# Patient Record
Sex: Male | Born: 1970 | Race: White | Hispanic: No | Marital: Married | Smoking: Former smoker
Health system: Southern US, Community
[De-identification: ages and names within clinical notes are randomized; demographics above are authoritative.]

## PROBLEM LIST (undated history)

## (undated) DIAGNOSIS — F909 Attention-deficit hyperactivity disorder, unspecified type: Secondary | ICD-10-CM

## (undated) HISTORY — PX: NO PAST SURGERIES: SHX2092

---

## 2017-07-18 ENCOUNTER — Ambulatory Visit
Admission: EM | Admit: 2017-07-18 | Discharge: 2017-07-18 | Disposition: A | Discharge status: Home / Self Care | Payer: Managed Care, Other (non HMO) | Attending: Family Medicine | Admitting: Family Medicine

## 2017-07-18 ENCOUNTER — Encounter: Payer: Self-pay | Admitting: Emergency Medicine

## 2017-07-18 ENCOUNTER — Ambulatory Visit (INDEPENDENT_AMBULATORY_CARE_PROVIDER_SITE_OTHER): Payer: Managed Care, Other (non HMO)

## 2017-07-18 DIAGNOSIS — M25511 Pain in right shoulder: Secondary | ICD-10-CM

## 2017-07-18 DIAGNOSIS — S46911A Strain of unspecified muscle, fascia and tendon at shoulder and upper arm level, right arm, initial encounter: Secondary | ICD-10-CM

## 2017-07-18 MED ORDER — MELOXICAM 15 MG PO TABS: 15.0000 mg | ORAL_TABLET | Freq: Every day | ORAL | 0 refills | Status: DC

## 2017-07-18 NOTE — ED Triage Notes (Signed)
Patient states that he fell about 2 weeks ago.  Patient reports ongoing pain in his right shoulder and upper arm.

## 2017-07-18 NOTE — ED Provider Notes (Signed)
[facility]    CSN: [id number] Arrival date & time: 07/18/17  0941     History   Chief Complaint Chief Complaint  Patient presents with  . Shoulder Pain    HPI Piercen Covino is a 46 y.o. male.   Patient is a 46 year old white male who states that in the first week of June  he fell against a wall and developed pain in his right shoulder. Unfortunately he did admit to having some alcohol in his system at the time and he cannot equivocally state how he fell on his right shoulder or what was the mechanism of injury. Since then he's had difficulty using his right shoulder and pain he finally came in to be seen and evaluated the right shoulder. Past medical history none seen pertinent surgical history no pertinent medical problems and no chronic or pertinent family medical history reviewed. He is a former smoker. No known drug allergies. He denies any loss of consciousness when he did fall and injured his right shoulder   The history is provided by the patient. No language interpreter was used.  Shoulder Pain  Location:  Shoulder Shoulder location:  R shoulder Injury: yes   Time since incident:  2 months Mechanism of injury: fall   Fall:    Fall occurred:  Walking and tripped   Impact surface:  [employer] of impact:  Right shoulder.   Entrapped after fall: no   Pain details:    Quality:  Aching, sharp and throbbing   Radiates to:  Does not radiate   Severity:  Moderate   Onset quality:  Sudden   Duration:  2 months   Timing:  Constant   Progression:  Worsening Handedness:  Right-handed Dislocation: no   Foreign body present:  No foreign bodies Prior injury to area:  No Relieved by:  Nothing   History reviewed. No pertinent past medical history.  There are no active problems to display for this patient.   History reviewed. No pertinent surgical history.     Home Medications    Prior to Admission medications   Not on File    Family History History  reviewed. No pertinent family history.  Social History Social History  Substance Use Topics  . Smoking status: Former Games developer  . Smokeless tobacco: Never Used  . Alcohol use Yes     Allergies   Patient has no known allergies.   Review of Systems Review of Systems  Musculoskeletal: Positive for arthralgias, joint swelling and myalgias.  All other systems reviewed and are negative.    Physical Exam Triage Vital Signs ED Triage Vitals  Enc Vitals Group     BP 07/18/17 1000 119/87     Pulse Rate 07/18/17 1000 71     Resp 07/18/17 1000 16     Temp 07/18/17 1000 97.9 F (36.6 C)     Temp Source 07/18/17 1000 Oral     SpO2 07/18/17 1000 96 %     Weight 07/18/17 0957 220 lb (99.8 kg)     Height 07/18/17 0957 5\' 11"  (1.803 m)     Head Circumference --      Peak Flow --      Pain Score 07/18/17 0957 7     Pain Loc --      Pain Edu? --      Excl. in [place]? --    No data found.   Updated Vital Signs BP 119/87 (BP Location: Left Arm)  Pulse 71   Temp 97.9 F (36.6 C) (Oral)   Resp 16   Ht 5\' 11"  (1.803 m)   Wt 220 lb (99.8 kg)   SpO2 96%   BMI 30.68 kg/m   Visual Acuity Right Eye Distance:   Left Eye Distance:   Bilateral Distance:    Right Eye Near:   Left Eye Near:    Bilateral Near:     Physical Exam  Constitutional: He is oriented to person, place, and time. He appears well-developed and well-nourished.  HENT:  Head: Normocephalic and atraumatic.  Right Ear: External ear normal.  Left Ear: External ear normal.  Mouth/Throat: Oropharynx is clear and moist.  Eyes: Pupils are equal, round, and reactive to light.  Neck: Normal range of motion. Neck supple.  Pulmonary/Chest: Effort normal.  Musculoskeletal: He exhibits tenderness.       Right shoulder: He exhibits tenderness, bony tenderness, spasm and decreased strength.  Patient right shoulder looks as if this atrophy compared to the left shoulder he has difficulty raising the right arm above his head  also unable to resist me with the raised arm and empty beer can maneuver. Is also unable to bring his right arm all the way to the back like his left arm indicating some freezing of that right shoulders well.  Neurological: He is alert and oriented to person, place, and time.  Skin: Skin is warm.  Psychiatric: He has a normal mood and affect.  Vitals reviewed.    UC Treatments / Results  Labs (all labs ordered are listed, but only abnormal results are displayed) Labs Reviewed - No data to display  EKG  EKG Interpretation None       Radiology No results found.  Procedures Procedures (including critical care time)  Medications Ordered in UC Medications - No data to display   Initial Impression / Assessment and Plan / UC Course  I have reviewed the triage vital signs and the nursing notes.  Pertinent labs & imaging results that were available during my care of the patient were reviewed by me and considered in my medical decision making (see chart for details).     We'll x-ray the right shoulder are recommend right sling but concern over possible rotator cuff injury referring to orthopedic for further evaluation and possible MRI of the shoulder   We'll place the sling place a Mobic 15 mg daily can see the orthopedic  Final Clinical Impressions(s) / UC Diagnoses   Final diagnoses:  None    New Prescriptions New Prescriptions   No medications on file    Note: This dictation was prepared with Dragon dictation along with smaller phrase technology. Any transcriptional errors that result from this process are unintentional. Controlled Substance Prescriptions [place] Controlled Substance Registry consulted? Not Applicable   Hassan RowanWade, Radley Teston, MD 07/18/17 2206

## 2020-05-19 ENCOUNTER — Other Ambulatory Visit: Payer: Self-pay

## 2020-05-19 ENCOUNTER — Encounter: Payer: Self-pay | Admitting: Emergency Medicine

## 2020-05-19 ENCOUNTER — Ambulatory Visit
Admission: EM | Admit: 2020-05-19 | Discharge: 2020-05-19 | Disposition: A | Discharge status: Home / Self Care | Payer: 59 | Attending: Family Medicine | Admitting: Family Medicine

## 2020-05-19 ENCOUNTER — Ambulatory Visit (INDEPENDENT_AMBULATORY_CARE_PROVIDER_SITE_OTHER): Payer: 59

## 2020-05-19 DIAGNOSIS — S93602A Unspecified sprain of left foot, initial encounter: Secondary | ICD-10-CM

## 2020-05-19 DIAGNOSIS — S93402A Sprain of unspecified ligament of left ankle, initial encounter: Secondary | ICD-10-CM | POA: Diagnosis not present

## 2020-05-19 MED ORDER — KETOROLAC TROMETHAMINE 10 MG PO TABS: 10.0000 mg | ORAL_TABLET | Freq: Four times a day (QID) | ORAL | 0 refills | Status: DC | PRN

## 2020-05-19 NOTE — ED Provider Notes (Signed)
[facility]    CSN: [id number] Arrival date & time: 05/19/20  8469      History   Chief Complaint Chief Complaint  Patient presents with  . Ankle Pain    left   HPI  49 year old male presents with the above complaint.  Patient states that he rolled his ankle and foot last night.  He states that he twisted it on some debris on the floor.  Patient reports that he is having difficulty ambulating due to pain.  He reports pain of the lateral ankle as well as the foot.  Pain 9/10 in severity particular with ambulation.  Better with rest.  No other associated symptoms.  No other complaints.   Home Medications    Prior to Admission medications   Medication Sig Start Date End Date Taking? Authorizing Provider  ketorolac (TORADOL) 10 MG tablet Take 1 tablet (10 mg total) by mouth every 6 (six) hours as needed for moderate pain or severe pain. 05/19/20   Coral Spikes, DO    Family History Family History  Problem Relation Age of Onset  . Healthy Mother   . Liver disease Father     Social History Social History   Tobacco Use  . Smoking status: Former Research scientist (life sciences)  . Smokeless tobacco: Never Used  Substance Use Topics  . Alcohol use: Yes    Comment: "few" glasses of wine or beer nightly   . Drug use: No     Allergies   Patient has no known allergies.   Review of Systems Review of Systems  Musculoskeletal:       Left foot and ankle pain.   Physical Exam Triage Vital Signs ED Triage Vitals  Enc Vitals Group     BP 05/19/20 0952 (!) 121/94     Pulse Rate 05/19/20 0952 86     Resp 05/19/20 0952 18     Temp 05/19/20 0952 98.3 F (36.8 C)     Temp Source 05/19/20 0952 Oral     SpO2 05/19/20 0952 97 %     Weight 05/19/20 0949 203 lb (92.1 kg)     Height 05/19/20 0949 5\' 11"  (1.803 m)     Head Circumference --      Peak Flow --      Pain Score 05/19/20 0948 9     Pain Loc --      Pain Edu? --      Excl. in [place]? --    Updated Vital Signs BP (!) 121/94 (BP  Location: Right Arm)   Pulse 86   Temp 98.3 F (36.8 C) (Oral)   Resp 18   Ht 5\' 11"  (1.803 m)   Wt 92.1 kg   SpO2 97%   BMI 28.31 kg/m   Visual Acuity Right Eye Distance:   Left Eye Distance:   Bilateral Distance:    Right Eye Near:   Left Eye Near:    Bilateral Near:     Physical Exam Constitutional:      General: He is not in acute distress.    Appearance: Normal appearance. He is not ill-appearing.  HENT:     Head: Normocephalic and atraumatic.  Eyes:     General:        Right eye: No discharge.        Left eye: No discharge.     Conjunctiva/sclera: Conjunctivae normal.  Cardiovascular:     Rate and Rhythm: Normal rate and regular rhythm.     Heart sounds:  No murmur.  Pulmonary:     Effort: Pulmonary effort is normal.     Breath sounds: Normal breath sounds. No wheezing, rhonchi or rales.  Musculoskeletal:     Comments: Left foot and ankle -tenderness at the base of the fifth metatarsal.  No significant swelling noted.  Mild tenderness around the anterior ankle.  Neurological:     Mental Status: He is alert.  Psychiatric:        Mood and Affect: Mood normal.        Behavior: Behavior normal.    UC Treatments / Results  Labs (all labs ordered are listed, but only abnormal results are displayed) Labs Reviewed - No data to display  EKG   Radiology DG Ankle Complete Left  Result Date: 05/19/2020 CLINICAL DATA:  Fall, left ankle pain, initial encounter. EXAM: LEFT ANKLE COMPLETE - 3+ VIEW COMPARISON:  None. FINDINGS: No acute osseous abnormality. No joint effusion. Ankle mortise is intact. Calcaneal spurs are noted. IMPRESSION: No acute findings. Electronically Signed   By: Leanna Battles M.D.   On: 05/19/2020 10:26   DG Foot Complete Left  Result Date: 05/19/2020 CLINICAL DATA:  Fall, left foot and ankle pain, initial encounter. EXAM: LEFT FOOT - COMPLETE 3+ VIEW COMPARISON:  None. FINDINGS: No acute osseous abnormality. Very mild degenerative change at the  first metatarsophalangeal joint. Calcaneal spurs. IMPRESSION: No acute findings. Electronically Signed   By: Leanna Battles M.D.   On: 05/19/2020 10:27    Procedures Procedures (including critical care time)  Medications Ordered in UC Medications - No data to display  Initial Impression / Assessment and Plan / UC Course  I have reviewed the triage vital signs and the nursing notes.  Pertinent labs & imaging results that were available during my care of the patient were reviewed by me and considered in my medical decision making (see chart for details).    49 year old male presents with foot and ankle sprain.  X-ray obtained today and independently reviewed by me.  Interpretation: X-rays of the foot and ankle negative for acute fracture.  Patient placed in cam walker for comfort.  Toradol as needed.  Work note given.  Final Clinical Impressions(s) / UC Diagnoses   Final diagnoses:  Sprain of left foot, initial encounter  Sprain of left ankle, unspecified ligament, initial encounter     Discharge Instructions     Medication as needed.  Rest, ice, elevation.  Take care  Dr. Adriana Simas    ED Prescriptions    Medication Sig Dispense Auth. Provider   ketorolac (TORADOL) 10 MG tablet Take 1 tablet (10 mg total) by mouth every 6 (six) hours as needed for moderate pain or severe pain. 20 tablet Tommie Sams, DO     PDMP not reviewed this encounter.   Tommie Sams, [place] 05/19/20 1130

## 2020-05-19 NOTE — Discharge Instructions (Signed)
Medication as needed.  Rest, ice, elevation.  Take care  Dr. Adriana Simas

## 2020-05-19 NOTE — ED Triage Notes (Signed)
Pt c/o left ankle pain. He states he rolled his ankle over last night and has not been able to walk on it since. He states the ankle and the side of his foot hurts.

## 2020-09-10 ENCOUNTER — Other Ambulatory Visit: Payer: Self-pay

## 2020-09-10 ENCOUNTER — Emergency Department
Admission: EM | Admit: 2020-09-10 | Discharge: 2020-09-11 | Disposition: A | Discharge status: Home / Self Care | Payer: 59 | Attending: Emergency Medicine | Admitting: Emergency Medicine

## 2020-09-10 ENCOUNTER — Emergency Department: Payer: 59

## 2020-09-10 DIAGNOSIS — W108XXA Fall (on) (from) other stairs and steps, initial encounter: Secondary | ICD-10-CM | POA: Insufficient documentation

## 2020-09-10 DIAGNOSIS — S22010A Wedge compression fracture of first thoracic vertebra, initial encounter for closed fracture: Secondary | ICD-10-CM | POA: Diagnosis not present

## 2020-09-10 DIAGNOSIS — Z87891 Personal history of nicotine dependence: Secondary | ICD-10-CM | POA: Diagnosis not present

## 2020-09-10 DIAGNOSIS — F10929 Alcohol use, unspecified with intoxication, unspecified: Secondary | ICD-10-CM

## 2020-09-10 DIAGNOSIS — F10129 Alcohol abuse with intoxication, unspecified: Secondary | ICD-10-CM | POA: Diagnosis not present

## 2020-09-10 DIAGNOSIS — S060X1A Concussion with loss of consciousness of 30 minutes or less, initial encounter: Secondary | ICD-10-CM | POA: Diagnosis not present

## 2020-09-10 NOTE — ED Triage Notes (Signed)
Pt arrives [facility]  w cc of fall, etoh onboard. Pt fell down flight of stairs and hit head on cinderblock. LOC lasted approx 20 seconds per wife. Pt is A&Ox4, but randomly asking "why am I here?" Pt cannot accurately describe pain when asked by this RN. When moving R arm for blood draw, pt winced and states "My right shoulder hurts a little. 3 or a 4."

## 2020-09-11 ENCOUNTER — Emergency Department: Payer: 59

## 2020-09-11 LAB — CBC WITH DIFFERENTIAL/PLATELET
Abs Immature Granulocytes: 0.23 10*3/uL — ABNORMAL HIGH (ref 0.00–0.07)
Basophils Absolute: 0.1 10*3/uL (ref 0.0–0.1)
Basophils Relative: 2 %
Eosinophils Absolute: 0.3 10*3/uL (ref 0.0–0.5)
Eosinophils Relative: 3 %
HCT: 41.8 % (ref 39.0–52.0)
Hemoglobin: 14.4 g/dL (ref 13.0–17.0)
Immature Granulocytes: 3 %
Lymphocytes Relative: 32 %
Lymphs Abs: 2.7 10*3/uL (ref 0.7–4.0)
MCH: 33 pg (ref 26.0–34.0)
MCHC: 34.4 g/dL (ref 30.0–36.0)
MCV: 95.7 fL (ref 80.0–100.0)
Monocytes Absolute: 0.7 10*3/uL (ref 0.1–1.0)
Monocytes Relative: 8 %
Neutro Abs: 4.6 10*3/uL (ref 1.7–7.7)
Neutrophils Relative %: 52 %
Platelets: 266 10*3/uL (ref 150–400)
RBC: 4.37 MIL/uL (ref 4.22–5.81)
RDW: 13 % (ref 11.5–15.5)
WBC: 8.5 10*3/uL (ref 4.0–10.5)
nRBC: 0 % (ref 0.0–0.2)

## 2020-09-11 LAB — BASIC METABOLIC PANEL
Anion gap: 12 (ref 5–15)
BUN: 15 mg/dL (ref 6–20)
CO2: 21 mmol/L — ABNORMAL LOW (ref 22–32)
Calcium: 8.4 mg/dL — ABNORMAL LOW (ref 8.9–10.3)
Chloride: 106 mmol/L (ref 98–111)
Creatinine, Ser: 0.96 mg/dL (ref 0.61–1.24)
GFR calc Af Amer: >60 mL/min (ref >60)
GFR calc non Af Amer: >60 mL/min (ref >60)
Glucose, Bld: 117 mg/dL — ABNORMAL HIGH (ref 70–99)
Potassium: 4.1 mmol/L (ref 3.5–5.1)
Sodium: 139 mmol/L (ref 135–145)

## 2020-09-11 LAB — ETHANOL: Alcohol, Ethyl (B): 302 mg/dL (ref <10)

## 2020-09-11 MED ORDER — DROPERIDOL 2.5 MG/ML IJ SOLN
INTRAMUSCULAR | Status: AC
  Filled 2020-09-11: qty 2

## 2020-09-11 MED ORDER — DROPERIDOL 2.5 MG/ML IJ SOLN
5.0000 mg | Freq: Once | INTRAMUSCULAR | Status: AC
Start: 2020-09-11 — End: 2020-09-11
  Administered 2020-09-11: 04:00:00 5 mg via INTRAVENOUS

## 2020-09-11 MED ORDER — DROPERIDOL 2.5 MG/ML IJ SOLN
5.0000 mg | Freq: Once | INTRAMUSCULAR | Status: AC
  Administered 2020-09-11: 04:00:00 5 mg via INTRAVENOUS
  Filled 2020-09-11: qty 2

## 2020-09-11 MED ORDER — HYDROCODONE-ACETAMINOPHEN 5-325 MG PO TABS: 2.0000 | ORAL_TABLET | Freq: Four times a day (QID) | ORAL | 0 refills | Status: DC | PRN

## 2020-09-11 NOTE — ED Notes (Addendum)
Pt cleaned up after episode of bowel incontinence. Shorts w belt placed in pt belonging bag on back of bed.

## 2020-09-11 NOTE — ED Notes (Signed)
Writer tried to give patient back his cup of water. Patient states "fuck you you piece of shit."

## 2020-09-11 NOTE — ED Notes (Signed)
Pt attempting to walk down hallway at this time, unsteady gait.

## 2020-09-11 NOTE — ED Notes (Signed)
Patient took IV out. THis writer stopped bleeding and placed clean bandage. Patient continues to call this Clinical research associate a "fuck you, piece of shit."

## 2020-09-11 NOTE — ED Notes (Signed)
Pt transported to MRI 

## 2020-09-11 NOTE — ED Provider Notes (Signed)
[facility] Emergency Department Provider Note  ____________________________________________   First MD Initiated Contact with Patient 09/10/20 2345     (approximate)  I have reviewed the triage vital signs and the nursing notes.   HISTORY  Chief Complaint Fall  Level 5 caveat: The patient's history is limited due to acute head injury and self-reported alcohol intoxication.  HPI Mario Mcgee is a 49 y.o. male with no known or communicated past medical history  who presents by EMS after a head injury.  The patient does not remember what happened but according to EMS, he fell walking down some stairs after drinking alcohol tonight.  He says that he drank a lot.  It is unclear if this is a normal occurrence for him.  According to what his wife told the paramedics, he fell down a flight of stairs and hit his head on a cinder block.  He was reportedly unconscious for about 20 seconds.  He was confused when EMS arrived, does not remember the event, and perseverating somewhat.  He is reporting some pain in the left side and back of his head as well as in his neck and he was placed in a c-collar by paramedics.  He is awake and alert and in good spirits during my assessment but does not have much memory of the event.  He continues to report some mild pain in his head and mild pain in his neck and the c-collar is still in place.  He also reports a mild to moderate pain in his right shoulder.  He denies chest pain, shortness of breath, nausea, vomiting, and abdominal pain.  He denies drug use.  Moving around makes the headache and the neck pain worse and nothing particular makes them better.        History reviewed. No pertinent past medical history.  There are no problems to display for this patient.   Past Surgical History:  Procedure Laterality Date  . NO PAST SURGERIES      Prior to Admission medications   Medication Sig Start Date End Date Taking? Authorizing  Provider  HYDROcodone-acetaminophen (NORCO/VICODIN) 5-325 MG tablet Take 2 tablets by mouth every 6 (six) hours as needed for moderate pain or severe pain. 09/11/20   Loleta Rose, MD  ketorolac (TORADOL) 10 MG tablet Take 1 tablet (10 mg total) by mouth every 6 (six) hours as needed for moderate pain or severe pain. 05/19/20   Tommie Sams, DO    Allergies Patient has no known allergies.  Family History  Problem Relation Age of Onset  . Healthy Mother   . Liver disease Father     Social History Social History   Tobacco Use  . Smoking status: Former Games developer  . Smokeless tobacco: Never Used  Vaping Use  . Vaping Use: Never used  Substance Use Topics  . Alcohol use: Yes    Comment: "few" glasses of wine or beer nightly   . Drug use: No    Review of Systems Level 5 caveat: The patient's history is limited due to acute head injury and self-reported alcohol intoxication.  Constitutional: No fever/chills Eyes: No visual changes. ENT: No sore throat. Cardiovascular: Denies chest pain. Respiratory: Denies shortness of breath. Gastrointestinal: No abdominal pain.  No nausea, no vomiting.  No diarrhea.  No constipation. Genitourinary: Negative for dysuria. Musculoskeletal: Some pain in the side of his head and his neck as well as his right shoulder. Integumentary: Negative for rash. Neurological: Amnesia to the  acute event and for a few minutes prior.  Mild headache, negative for focal weakness or numbness.   ____________________________________________   PHYSICAL EXAM:  VITAL SIGNS: ED Triage Vitals [09/10/20 2336]  Enc Vitals Group     BP 117/89     Pulse Rate 100     Resp 18     Temp 98.2 F (36.8 C)     Temp Source Oral     SpO2 100 %     Weight 92.1 kg (203 lb 0.7 oz)     Height 1.803 m (5\' 11" )     Head Circumference      Peak Flow      Pain Score 4     Pain Loc      Pain Edu?      Excl. in GC?     Constitutional: Alert and oriented but somewhat  confused. Eyes: Conjunctivae are normal.  Head: Large soft hematoma on the left posterior crown of his head with no laceration, tender to palpation.  The patient has some erythema and chronic skin thickening to his forehead and the upper part of his face which could be misperceived as acute, but the patient confirms that his skin has been this way for months. Nose: No congestion/rhinnorhea.  No epistaxis, no clear rhinorrhea. Mouth/Throat: Patient is wearing a mask. Neck: No stridor.  No meningeal signs.   Cardiovascular: Normal rate, regular rhythm. Good peripheral circulation. Grossly normal heart sounds. Respiratory: Normal respiratory effort.  No retractions. Gastrointestinal: Soft and nontender. No distention.  Musculoskeletal: Patient has some pain and tenderness with range of motion of the right shoulder but there is no limitation to the range of motion and no palpable nor visible deformities.  The pain is nonspecific and there is no area of point tenderness.  He also has some point tenderness to palpation at the bottom of his cervical spine when the c-collar was removed.  He also had some pain in that area when he flexes and extends his head, so after the test I replaced the c-collar. Neurologic:  Normal speech and language. No gross focal neurologic deficits are appreciated.  However he is confused and intermittently repeats questions.  He is redirectable but occasionally will wander off down the hall he cannot explain what he is doing. Skin:  Skin is warm, dry and intact. Psychiatric: Mood and affect initially seem normal but he is clearly confused, requires frequent redirection, amnestic to the event, and has difficulty following commands.  ____________________________________________   LABS (all labs ordered are listed, but only abnormal results are displayed)  Labs Reviewed  BASIC METABOLIC PANEL - Abnormal; Notable for the following components:      Result Value   CO2 21 (*)     Glucose, Bld 117 (*)    Calcium 8.4 (*)    All other components within normal limits  CBC WITH DIFFERENTIAL/PLATELET - Abnormal; Notable for the following components:   Abs Immature Granulocytes 0.23 (*)    All other components within normal limits  ETHANOL - Abnormal; Notable for the following components:   Alcohol, Ethyl (B) 302 (*)    All other components within normal limits   ____________________________________________  EKG  No indication for emergent EKG ____________________________________________  RADIOLOGY I, , personally viewed and evaluated these images (plain radiographs) as part of my medical decision making, as well as reviewing the written report by the radiologist.  ED MD interpretation: CT scan of the head is generally reassuring with no intracranial  abnormalities and a large external hematoma.  The cervical spine is mostly normal but there was a question of a slight bony abnormality to the anterior superior endplate of C6.  This is in the region that he has tenderness to palpation, however.  MRI of the cervical spine demonstrates T1 wedge compression fracture with no obvious cord involvement.  Official radiology report(s): DG Shoulder Right  Result Date: 09/11/2020 CLINICAL DATA:  Pain after a fall EXAM: RIGHT SHOULDER - 2+ VIEW COMPARISON:  None. FINDINGS: There is no evidence of fracture or dislocation. There is no evidence of arthropathy or other focal bone abnormality. Soft tissues are unremarkable. IMPRESSION: Negative. Electronically Signed   By: Burman Nieves M.D.   On: 09/11/2020 01:14   CT Head Wo Contrast  Result Date: 09/10/2020 CLINICAL DATA:  Patient fell down stairs, striking head. Midline neck tenderness. EXAM: CT HEAD WITHOUT CONTRAST CT CERVICAL SPINE WITHOUT CONTRAST TECHNIQUE: Multidetector CT imaging of the head and cervical spine was performed following the standard protocol without intravenous contrast. Multiplanar CT image  reconstructions of the cervical spine were also generated. COMPARISON:  None. FINDINGS: CT HEAD FINDINGS Brain: No evidence of acute infarction, hemorrhage, hydrocephalus, extra-axial collection or mass lesion/mass effect. Vascular: No hyperdense vessel or unexpected calcification. Skull: The calvarium appears intact. No depressed skull fractures. Prominent subcutaneous scalp hematoma over the left posterior parietal region. No evidence of underlying fracture or contusion. Sinuses/Orbits: Paranasal sinuses and mastoid air cells are clear. Other: None. CT CERVICAL SPINE FINDINGS Alignment: Straightening of the usual cervical lordosis. This is probably positional but muscle spasm could also have this appearance and is not excluded. No anterior subluxation. C1-2 articulation appears intact. Normal alignment of the facet joints. Skull base and vertebrae: Skull base appears intact. No vertebral compression deformities. No focal bone lesion or bone destruction. Linear lucency demonstrated at the base of an osteophyte off of the anterior superior endplate of C6. This is probably degenerative but a nondisplaced corner fracture could also have this appearance. Bone cortex is otherwise intact. Soft tissues and spinal canal: No prevertebral soft tissue swelling. No abnormal paraspinal soft tissue mass or infiltration. Cervical lymph nodes are not pathologically enlarged. Disc levels: Degenerative changes throughout the cervical spine with narrowed interspaces and endplate hypertrophic changes, most prominent at C5-6 and C6-7 levels. Degenerative changes in the facet joints. Mild bone encroachment upon the neural foramina bilaterally at C5-6. Upper chest: Visualized lung apices are clear. Heterogeneous appearance of the thyroid gland without significant nodularity. Other: None. IMPRESSION: 1. No acute intracranial abnormalities. Prominent subcutaneous scalp hematoma over the left posterior parietal region. 2. Nonspecific  straightening of usual cervical lordosis. This is probably positional but muscle spasm could also have this appearance and is not excluded. 3. Degenerative changes throughout the cervical spine. No acute displaced fractures identified. 4. Linear lucency at the base of an osteophyte off of the anterior superior endplate of C6 is probably degenerative but a nondisplaced corner fracture could also have this appearance. Correlation with physical examination recommended. Electronically Signed   By: Burman Nieves M.D.   On: 09/10/2020 23:58   CT Cervical Spine Wo Contrast  Result Date: 09/10/2020 CLINICAL DATA:  Patient fell down stairs, striking head. Midline neck tenderness. EXAM: CT HEAD WITHOUT CONTRAST CT CERVICAL SPINE WITHOUT CONTRAST TECHNIQUE: Multidetector CT imaging of the head and cervical spine was performed following the standard protocol without intravenous contrast. Multiplanar CT image reconstructions of the cervical spine were also generated. COMPARISON:  None. FINDINGS: CT HEAD  FINDINGS Brain: No evidence of acute infarction, hemorrhage, hydrocephalus, extra-axial collection or mass lesion/mass effect. Vascular: No hyperdense vessel or unexpected calcification. Skull: The calvarium appears intact. No depressed skull fractures. Prominent subcutaneous scalp hematoma over the left posterior parietal region. No evidence of underlying fracture or contusion. Sinuses/Orbits: Paranasal sinuses and mastoid air cells are clear. Other: None. CT CERVICAL SPINE FINDINGS Alignment: Straightening of the usual cervical lordosis. This is probably positional but muscle spasm could also have this appearance and is not excluded. No anterior subluxation. C1-2 articulation appears intact. Normal alignment of the facet joints. Skull base and vertebrae: Skull base appears intact. No vertebral compression deformities. No focal bone lesion or bone destruction. Linear lucency demonstrated at the base of an osteophyte off  of the anterior superior endplate of C6. This is probably degenerative but a nondisplaced corner fracture could also have this appearance. Bone cortex is otherwise intact. Soft tissues and spinal canal: No prevertebral soft tissue swelling. No abnormal paraspinal soft tissue mass or infiltration. Cervical lymph nodes are not pathologically enlarged. Disc levels: Degenerative changes throughout the cervical spine with narrowed interspaces and endplate hypertrophic changes, most prominent at C5-6 and C6-7 levels. Degenerative changes in the facet joints. Mild bone encroachment upon the neural foramina bilaterally at C5-6. Upper chest: Visualized lung apices are clear. Heterogeneous appearance of the thyroid gland without significant nodularity. Other: None. IMPRESSION: 1. No acute intracranial abnormalities. Prominent subcutaneous scalp hematoma over the left posterior parietal region. 2. Nonspecific straightening of usual cervical lordosis. This is probably positional but muscle spasm could also have this appearance and is not excluded. 3. Degenerative changes throughout the cervical spine. No acute displaced fractures identified. 4. Linear lucency at the base of an osteophyte off of the anterior superior endplate of C6 is probably degenerative but a nondisplaced corner fracture could also have this appearance. Correlation with physical examination recommended. Electronically Signed   By: Burman Nieves M.D.   On: 09/10/2020 23:58   MR Cervical Spine Wo Contrast  Result Date: 09/11/2020 CLINICAL DATA:  Fall down stairs EXAM: MRI CERVICAL SPINE WITHOUT CONTRAST TECHNIQUE: Multiplanar, multisequence MR imaging of the cervical spine was performed. No intravenous contrast was administered. COMPARISON:  CT cervical spine 09/10/2020 FINDINGS: Alignment: Physiologic. Vertebrae: Wedge compression fracture of T1 with mild marrow edema. There is edema within the inter spinous ligament at the C4-6 levels. Cord: Normal  spinal cord signal. The anterior and posterior longitudinal ligaments and ligamentum flavum are normal. No epidural collection. Posterior Fossa, vertebral arteries, paraspinal tissues: Edema within the posterior paraspinous tissues at the lower cervical spine. Disc levels: C2-3: Mild left facet hypertrophy.  No disc herniation or stenosis. C3-4: Large left foraminal disc osteophyte complex with severe left foraminal stenosis. No central spinal canal stenosis. C4-5: Normal disc.  No spinal canal or neural foraminal stenosis. C5-6: Right-greater-than-left uncovertebral hypertrophy with severe bilateral neural foraminal stenosis. C6-7: Small disc bulge with mild uncovertebral hypertrophy and mild bilateral foraminal stenosis. No spinal canal stenosis. C7-T1: Normal. IMPRESSION: 1. Acute compression fracture of T1 with mild marrow edema. No spinal canal stenosis. 2. C4-6 interspinous ligament injury.  No other ligamentous injury. 3. Severe bilateral C5-6 neural foraminal stenosis due to uncovertebral hypertrophy. 4. Severe left C3-4 neural foraminal stenosis due to large left foraminal disc osteophyte complex. 5. Mild bilateral C6-7 neural foraminal stenosis. Electronically Signed   By: Deatra Robinson M.D.   On: 09/11/2020 02:17    ____________________________________________   PROCEDURES   Procedure(s) performed (including Critical Care):  Marland Kitchen  Critical Care Performed by: Loleta Rose, MD Authorized by: Loleta Rose, MD   Critical care provider statement:    Critical care time (minutes):  30   Critical care time was exclusive of:  Separately billable procedures and treating other patients   Critical care was necessary to treat or prevent imminent or life-threatening deterioration of the following conditions: psych.   Critical care was time spent personally by me on the following activities:  Development of treatment plan with patient or surrogate, discussions with consultants, evaluation of patient's  response to treatment, examination of patient, obtaining history from patient or surrogate, ordering and performing treatments and interventions, ordering and review of laboratory studies, ordering and review of radiographic studies, pulse oximetry, re-evaluation of patient's condition and review of old charts     ____________________________________________   INITIAL IMPRESSION / MDM / ASSESSMENT AND PLAN / ED COURSE  As part of my medical decision making, I reviewed the following data within the electronic MEDICAL RECORD NUMBER Nursing notes reviewed and incorporated, Labs reviewed , Old chart reviewed, Patient signed out to Dr. Fanny Bien, Radiograph reviewed , A consult was requested and obtained from this/these consultant(s) Neurosurgery (Dr. Myer Haff), Notes from prior ED visits and [place] Controlled Substance Database   Differential diagnosis includes, but is not limited to, alcohol intoxication, intracranial bleeding, concussion, cervical spine injury, spinal cord injury, metabolic or electrolyte abnormality, less likely acute infection.  The patient is presenting as 1 with a concussion and/or intoxication.  He is perseverating occasionally and requires redirection and cannot really explain his behavior.  Although this could just be intoxication I think it is likely he has a component of concussion as well.  His CT head is reassuring and that he has no intracranial bleed, but his cervical spine CT is questionable and on exam he has point tenderness to palpation of the lower part of the cervical spine and some pain with range of motion of his head and neck.  Given the risk of spinal injury and him being an unreliable historian, I am obtaining an MRI of his cervical spine without contrast.  I have also ordered basic lab work including an ethanol level because if his ethanol level is negative I am more concerned about more substantial concussive symptoms.       Clinical Course as of Sep 11 724  Thu Sep 11, 2020  6945 Patient has an acute T1 wedge compression fracture without any obvious cord injury.  I called and discussed the case by phone with Dr. Myer Haff with neurosurgery.  He recommended a [place] J or Aspen collar and will follow up with the patient in clinic.  He did not recommend any additional imaging.  Given that the patient is still confused I am going to watch him for a little while longer in the emergency department while we attempt to procure a [place] J collar for him.  Basic metabolic panel and CBC are within normal limits.  Ethanol is still pending.   [CF]  0330 Alcohol, Ethyl (B)(!!): 302 [CF]  0330 Patient placed in [place] J collar successfully.  He is still difficult to redirect but is awake and alert and in no distress.  Still asking some of the same questions over and over again.  I will continue to observe him at least until the morning.  I gave him some water to drink which settled him down somewhat.  He is not combative but difficult to convince to relax.   [CF]  0342 The  patient suddenly decided he wanted to leave.  He has no plan, no one to pick him up, and no way to leave the hospital, but he was not redirectable.  When we continue to try to de-escalate the situation verbally and redirect him, he became agitated and verbally although not physically threatening.  He attempted to walk out of the emergency department.  Given the fact that he is intoxicated, exhibiting signs of head injury, and has an acute thoracic spine injury, and clearly lacks the capacity to make his own decisions, I had to place him under an emergency involuntary commitment for his own benefit.  He required a brief manual hold by staff and briefly by law enforcement, but once he realized he did not have a choice in the matter he consented to redirection back to his bed.  I am administering droperidol 5 mg IV as a calming agent for his own safety and the safety of the ED staff.   [CF]  [id number] Patient received the  droperidol approximately 20 minutes ago and instead of calming down is getting more agitated, threatening law enforcement, stating "just arrest me".  Administering another droperidol 5 mg IV stat to calm him down so that he does not present a danger to himself or others.   [CF]  [phone] Patient sleeping.  Discussed case with Dr. Fanny BienQuale who is assuming care.  He will reassess the patient when he is awake, anticipating that if he is back to normal and demonstrating capacity, Dr. Fanny BienQuale will revoke the IVC and the patient can be discharged with a sober adult.  I do not believe he needs medical nor psychiatric admission at this time.   [CF]    Clinical Course User Index [CF] Loleta RoseForbach, Kasha Howeth, MD     ____________________________________________  FINAL CLINICAL IMPRESSION(S) / ED DIAGNOSES  Final diagnoses:  Concussion with loss of consciousness of 30 minutes or less, initial encounter  Alcoholic intoxication with complication (HCC)  Closed wedge compression fracture of T1 vertebra, initial encounter (HCC)     MEDICATIONS GIVEN DURING THIS VISIT:  Medications  droperidol (INAPSINE) 2.5 MG/ML injection 5 mg (5 mg Intravenous Given 09/11/20 0352)  droperidol (INAPSINE) 2.5 MG/ML injection 5 mg (5 mg Intravenous Given 09/11/20 0417)     ED Discharge Orders         Ordered    HYDROcodone-acetaminophen (NORCO/VICODIN) 5-325 MG tablet  Every 6 hours PRN        09/11/20 [id number]          *Please note:  Laurence SpatesShawn Connaughton was evaluated in Emergency Department on 09/11/2020 for the symptoms described in the history of present illness. He was evaluated in the context of the global COVID-19 pandemic, which necessitated consideration that the patient might be at risk for infection with the SARS-CoV-2 virus that causes COVID-19. Institutional protocols and algorithms that pertain to the evaluation of patients at risk for COVID-19 are in a state of rapid change based on information released by regulatory bodies  including the CDC and federal and state organizations. These policies and algorithms were followed during the patient's care in the ED.  Some ED evaluations and interventions may be delayed as a result of limited staffing during and after the pandemic.*  Note:  This document was prepared using Dragon voice recognition software and may include unintentional dictation errors.   Loleta RoseForbach, Xaden Kaufman, MD 09/11/20 [phone]

## 2020-09-11 NOTE — ED Notes (Signed)
Patient discharged home to wife, patient received discharge papers and prescription. Patient received  belongings and verbalized he has received all of his belongings. Patient appropriate and cooperative, Denies SI/HI AVH. Vital signs taken. NAD noted.

## 2020-09-11 NOTE — Discharge Instructions (Addendum)
As we discussed, your high level of alcohol intoxication was likely a contributing factor to your fall last night.  You were showing signs of a concussion (lack of memory of the event, persistent questioning of the same questions over and over again, etc.).  You do not need to be admitted to the hospital, but unfortunately you broke one of the bones in your spine, the first thoracic vertebra.  This is likely not a surgical issue and the neurosurgeon with whom we spoke is comfortable with you following up in clinic, but you need to use the neck brace (cervical collar) that you were provided.  Please call the office of Dr. Myer Haff to schedule a follow-up appointment.  Use over-the-counter ibuprofen and/or Tylenol as needed for pain control according to label instructions.  Take Norco as prescribed for severe pain. Do not drink alcohol, drive or participate in any other potentially dangerous activities while taking this medication as it may make you sleepy. Do not take this medication with any other sedating medications, either prescription or over-the-counter. If you were prescribed Percocet or Vicodin, do not take these with acetaminophen (Tylenol) as it is already contained within these medications.   This medication is an opiate (or narcotic) pain medication and can be habit forming.  Use it as little as possible to achieve adequate pain control.  Do not use or use it with extreme caution if you have a history of opiate abuse or dependence.  If you are on a pain contract with your primary care doctor or a pain specialist, be sure to let them know you were prescribed this medication today from the [facility] Emergency Department.  This medication is intended for your use only - do not give any to anyone else and keep it in a secure place where nobody else, especially children, have access to it.  It will also cause or worsen constipation, so you may want to consider taking an over-the-counter stool  softener while you are taking this medication.  DO NOT TAKE THIS MEDICATION ALONG WITH ALCOHOL.    Return to the emergency department if you develop new or worsening symptoms that concern you.

## 2020-09-11 NOTE — ED Notes (Addendum)
[place] J collar in place per EDP orders.

## 2020-09-11 NOTE — ED Provider Notes (Signed)
Patient is ambulatory, using the bathroom out stress.  Very pleasant.  Normal vital signs.  He is fully alert, oriented to situation date and time.  He understands his neck fracture and that he needs to maintain the use of cervical collar and follow-up with neurosurgery.  He is fully oriented, denies any desire to hurt himself or anyone else.  Reports heavy alcohol use last night.  He appears appropriate for discharge.  He does not have any signs or symptoms suggest the need to maintain under involuntary commitment.  He is agreeable to having his wife come pick him up and will follow up with neurosurgery.  Not driving today.  Return precautions and treatment recommendations and follow-up discussed with the patient who is agreeable with the plan.   I have rescinded the patient's IVC is not he is now fully alert, oriented, appears to be of normal capacity.   Sharyn Creamer, MD 09/11/20 [phone]

## 2020-09-11 NOTE — ED Notes (Signed)
Called patient wife Carollee Herter and she will be here by 0900 to pick patient up

## 2021-03-09 IMAGING — CT CT HEAD W/O CM
3 series · 14 of 47 positions shown, 16 images · non-contrast
Comparison: None.

CLINICAL DATA: Patient fell down stairs, striking head. Midline
neck tenderness.

EXAM:
CT HEAD WITHOUT CONTRAST
CT CERVICAL SPINE WITHOUT CONTRAST
TECHNIQUE: Multidetector CT imaging of the head and cervical spine was
performed following the standard protocol without intravenous
contrast. Multiplanar CT image reconstructions of the cervical spine
were also generated.

[Series 2: head wo · axial · 0.45mm/px · z∈[+502,+627]mm · 8 of 31 slices shown, 10 images]
[im 3/31  brain]
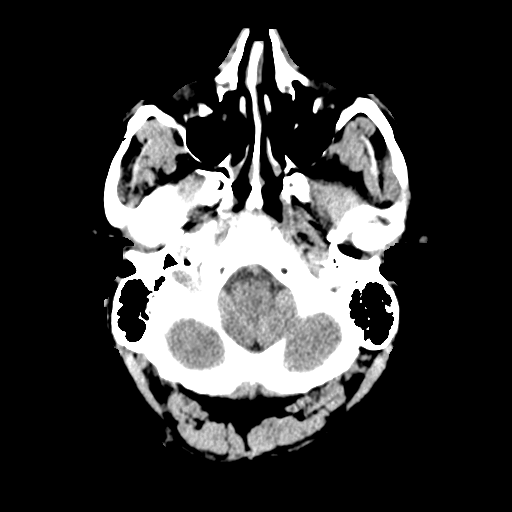
[im 3/31  bone]
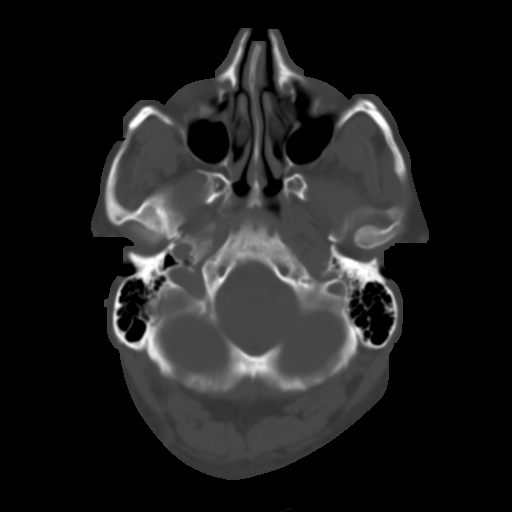
[im 7/31  brain]
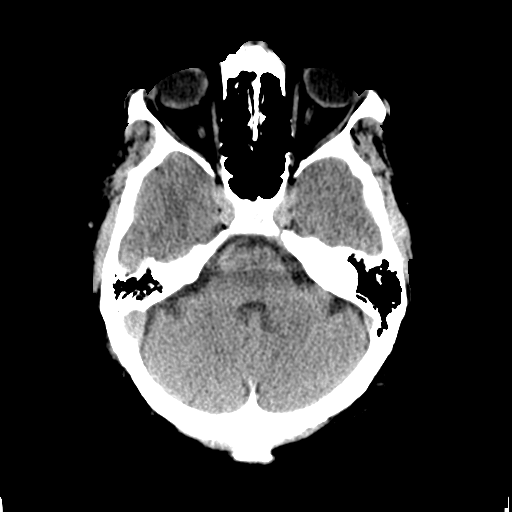
[im 10/31  brain]
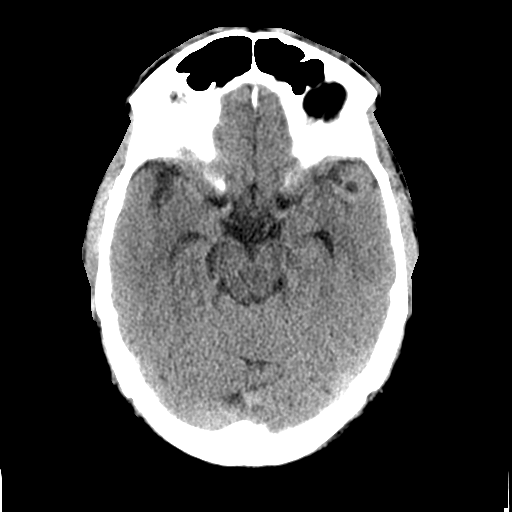
[im 14/31  brain]
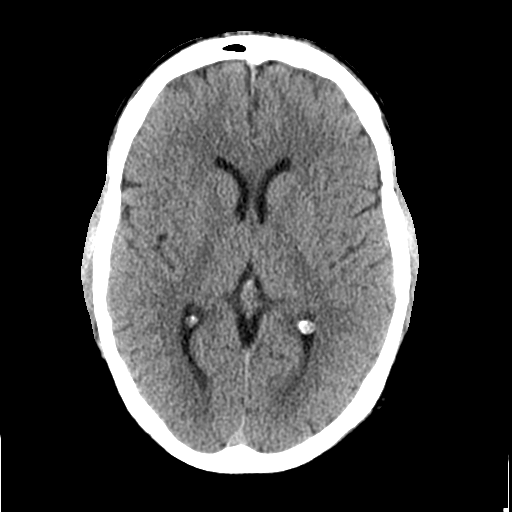
[im 17/31  brain]
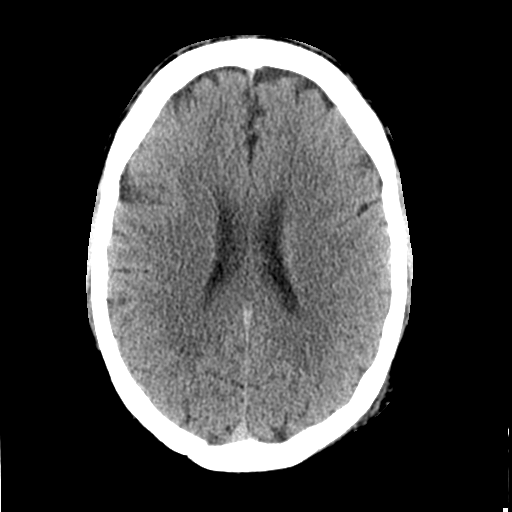
[im 17/31  bone]
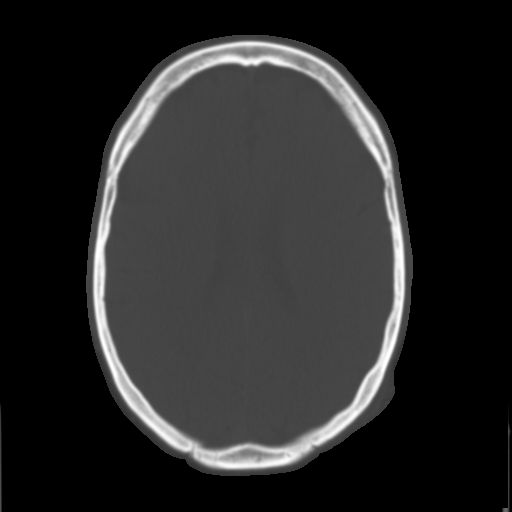
[im 21/31  brain]
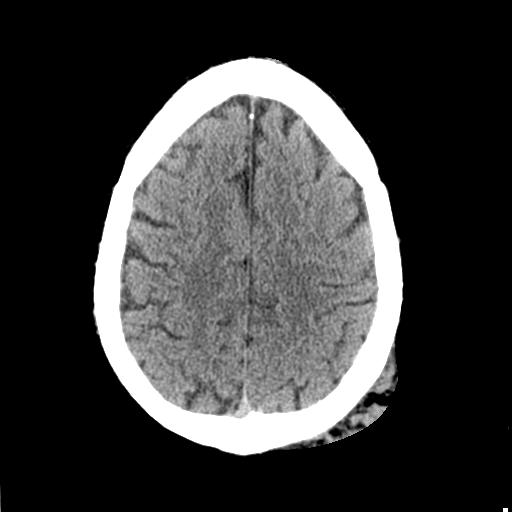
[im 24/31  brain]
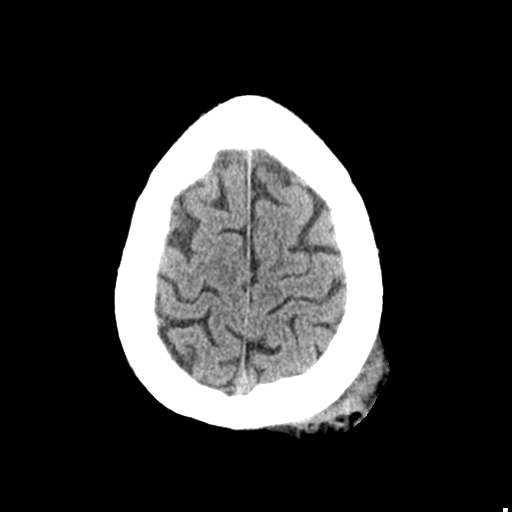
[im 28/31  brain]
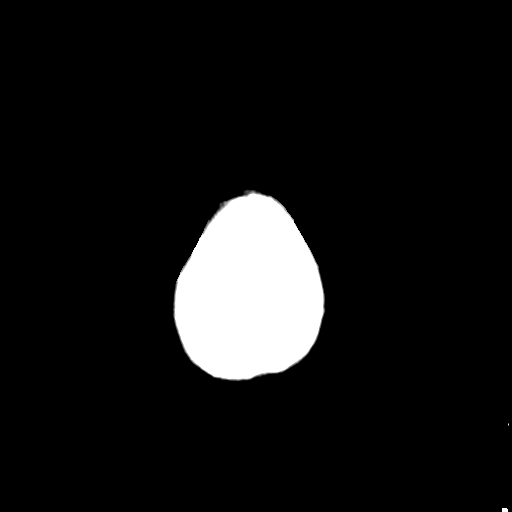

[Series 4: coronal soft tissue · coronal · 0.29mm/px · 3 of 70 slices shown]
[im 24/70  brain]
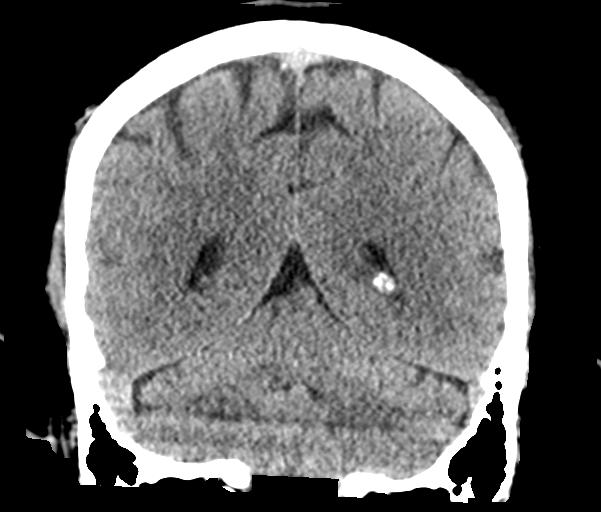
[im 31/70  brain]
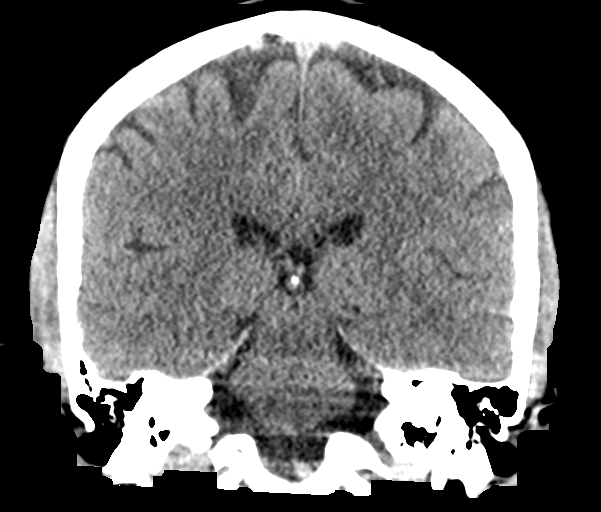
[im 39/70  brain]
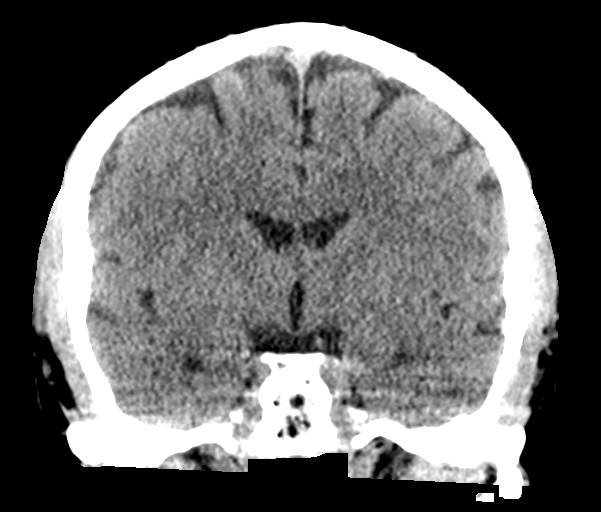

[Series 5: sagittal soft tissue · sagittal · 0.31mm/px · 3 of 56 slices shown]
[im 19/56  brain]
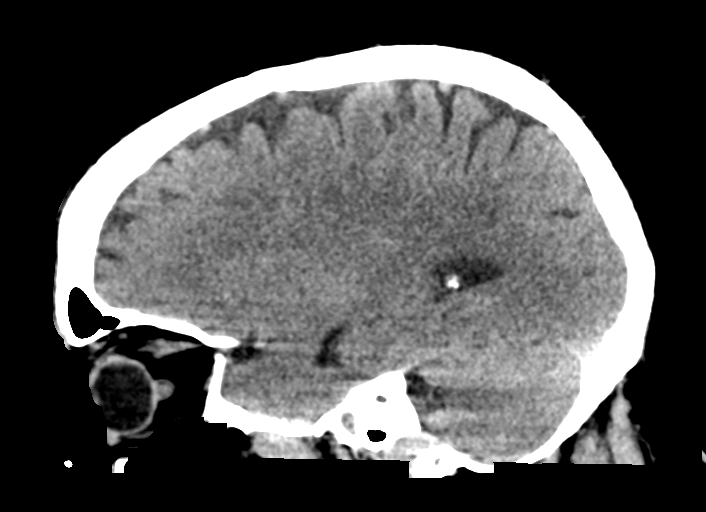
[im 28/56  brain]
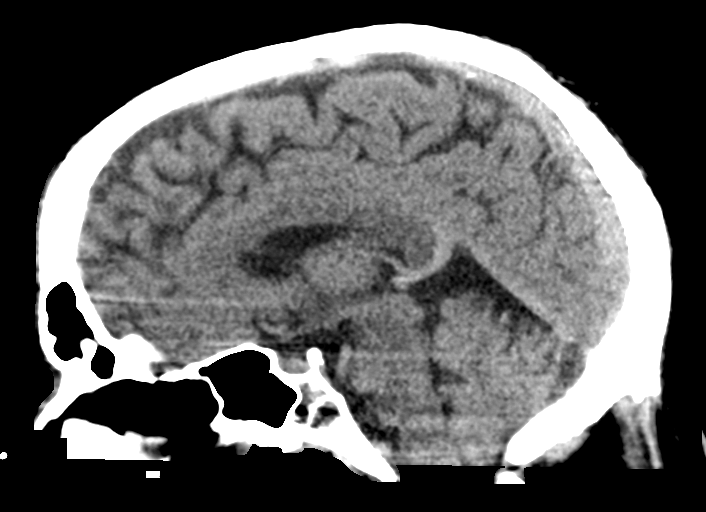
[im 37/56  brain]
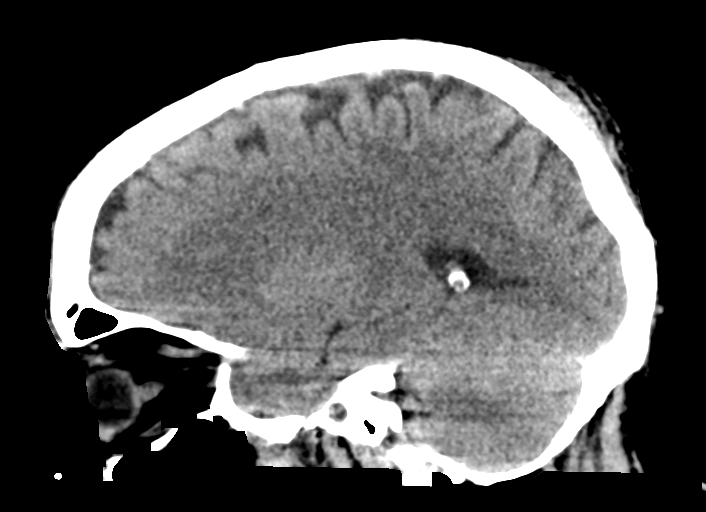

[14 of 47 positions shown; findings below may reference images not displayed]

FINDINGS: CT HEAD FINDINGS

Brain: No evidence of acute infarction, hemorrhage, hydrocephalus,
extra-axial collection or mass lesion/mass effect.

Vascular: No hyperdense vessel or unexpected calcification.

Skull: The calvarium appears intact. No depressed skull fractures.
Prominent subcutaneous scalp hematoma over the left posterior
parietal region. No evidence of underlying fracture or contusion.

Sinuses/Orbits: Paranasal sinuses and mastoid air cells are clear.

Other: None.

CT CERVICAL SPINE FINDINGS

Alignment: Straightening of the usual cervical lordosis. This is
probably positional but muscle spasm could also have this appearance
and is not excluded. No anterior subluxation. C1-2 articulation
appears intact. Normal alignment of the facet joints.

Skull base and vertebrae: Skull base appears intact. No vertebral
compression deformities. No focal bone lesion or bone destruction.
Linear lucency demonstrated at the base of an osteophyte off of the
anterior superior endplate of C6. This is probably degenerative but
a nondisplaced corner fracture could also have this appearance. Bone
cortex is otherwise intact.

Soft tissues and spinal canal: No prevertebral soft tissue swelling.
No abnormal paraspinal soft tissue mass or infiltration. Cervical
lymph nodes are not pathologically enlarged.

Disc levels: Degenerative changes throughout the cervical spine with
narrowed interspaces and endplate hypertrophic changes, most
prominent at C5-6 and C6-7 levels. Degenerative changes in the facet
joints. Mild bone encroachment upon the neural foramina bilaterally
at C5-6.

Upper chest: Visualized lung apices are clear. Heterogeneous
appearance of the thyroid gland without significant nodularity.

Other: None.
IMPRESSION: 1. No acute intracranial abnormalities. Prominent subcutaneous scalp
hematoma over the left posterior parietal region.
2. Nonspecific straightening of usual cervical lordosis. This is
probably positional but muscle spasm could also have this appearance
and is not excluded.
3. Degenerative changes throughout the cervical spine. No acute
displaced fractures identified.
4. Linear lucency at the base of an osteophyte off of the anterior
superior endplate of C6 is probably degenerative but a nondisplaced
corner fracture could also have this appearance. Correlation with
physical examination recommended.

## 2021-03-10 IMAGING — CR DG SHOULDER 2+V*R*
3 series · 3 of 3 positions shown · non-contrast
Comparison: None.

CLINICAL DATA: Pain after a fall

EXAM:
RIGHT SHOULDER - 2+ VIEW

[shoulder grashey]
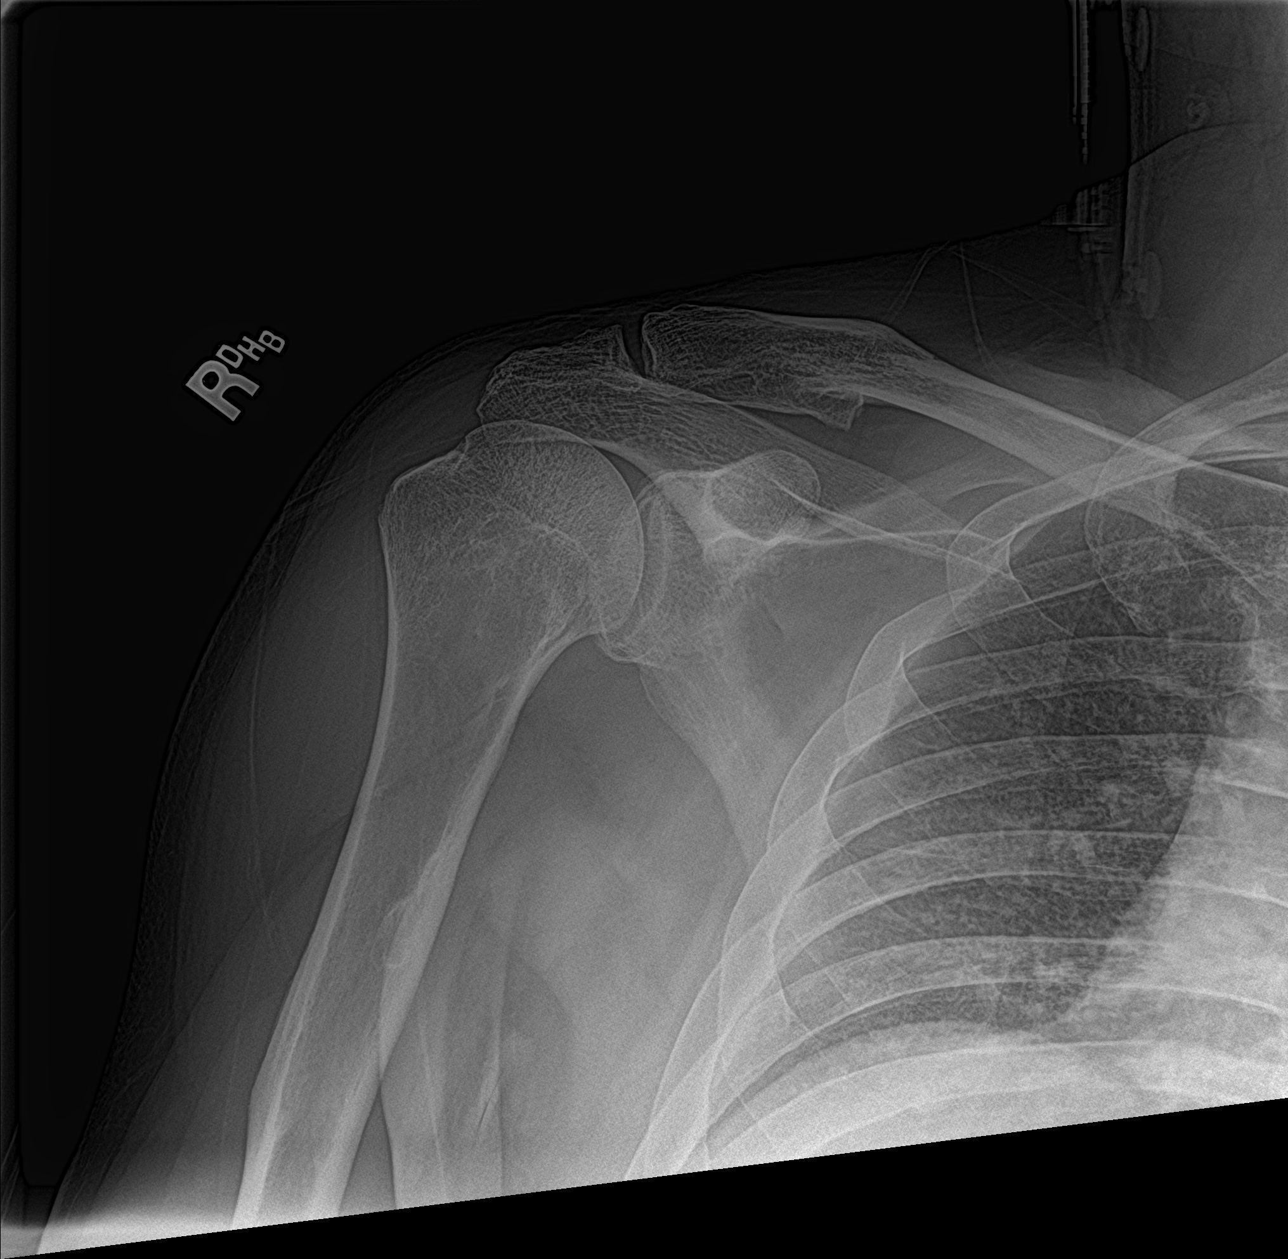

[shoulder y view]
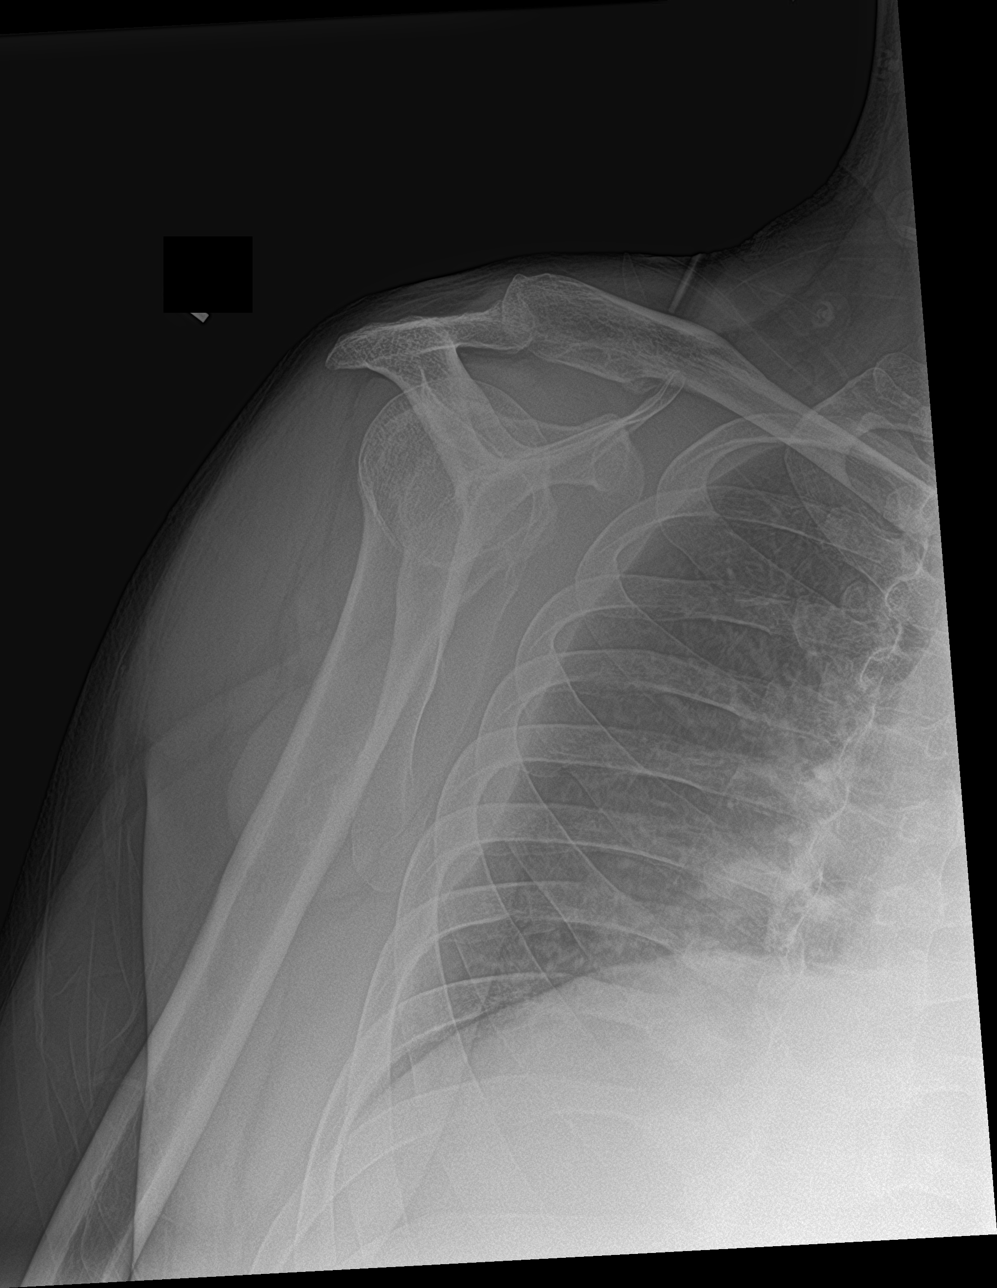

[shoulder ap neutral]
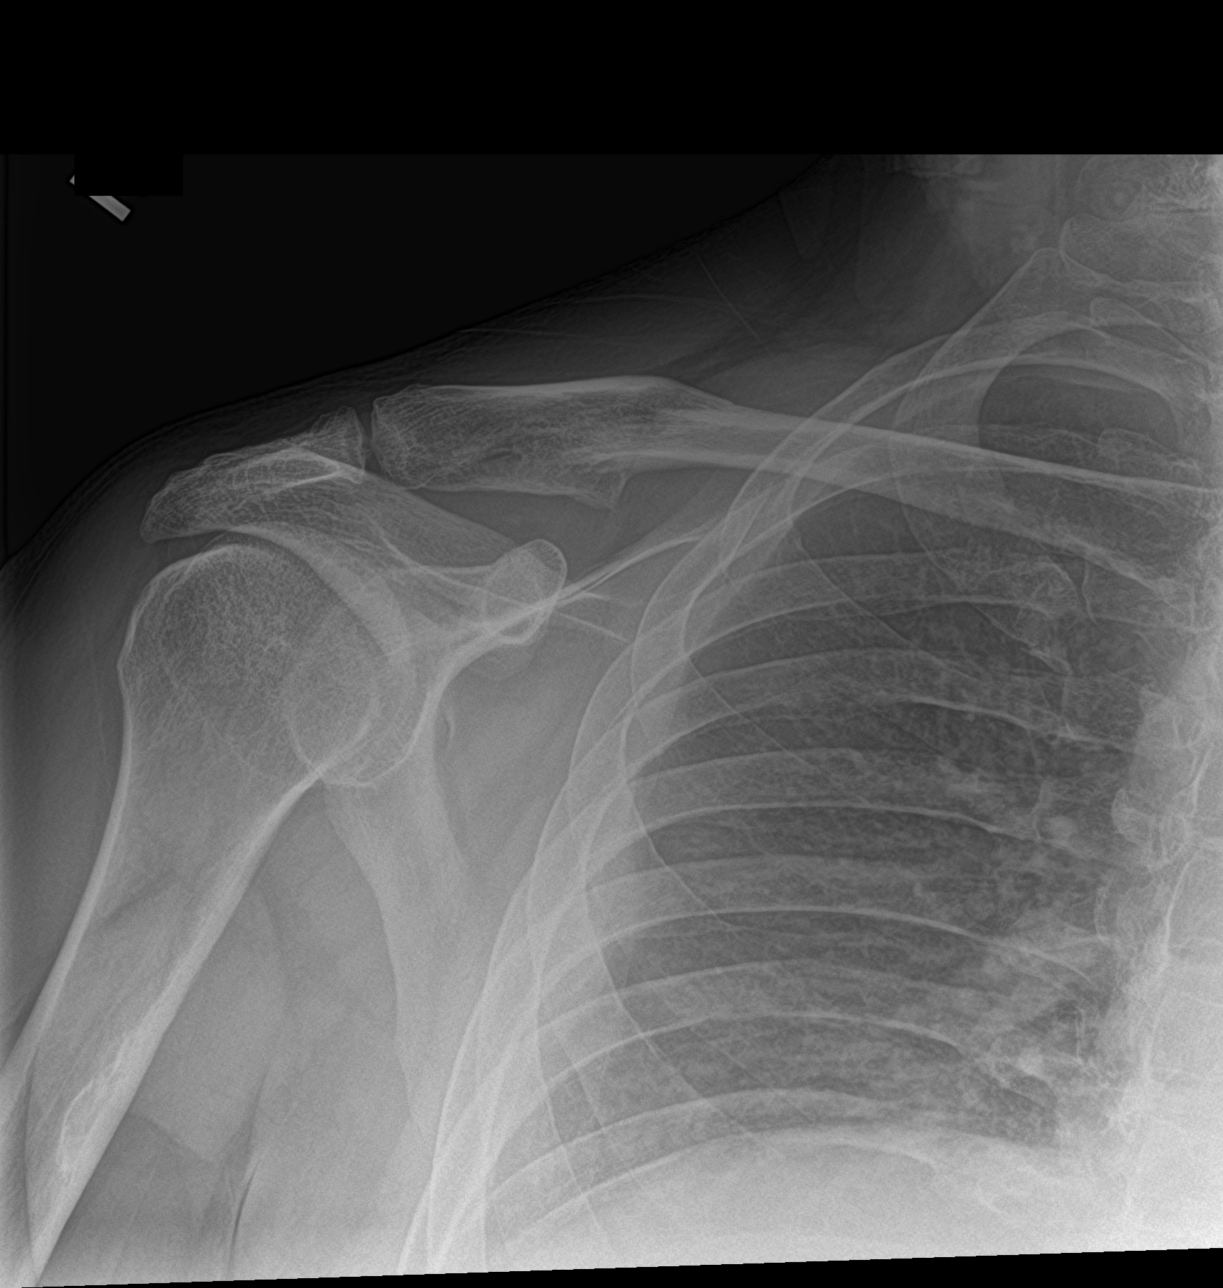

[3 of 3 positions shown; findings below may reference images not displayed]

FINDINGS: There is no evidence of fracture or dislocation. There is no
evidence of arthropathy or other focal bone abnormality. Soft
tissues are unremarkable.
IMPRESSION: Negative.

## 2022-09-20 DIAGNOSIS — M25469 Effusion, unspecified knee: Secondary | ICD-10-CM | POA: Insufficient documentation

## 2022-09-20 DIAGNOSIS — M1711 Unilateral primary osteoarthritis, right knee: Secondary | ICD-10-CM | POA: Insufficient documentation

## 2023-05-25 ENCOUNTER — Ambulatory Visit (INDEPENDENT_AMBULATORY_CARE_PROVIDER_SITE_OTHER): Payer: BC Managed Care – PPO

## 2023-05-25 ENCOUNTER — Ambulatory Visit
Admission: EM | Admit: 2023-05-25 | Discharge: 2023-05-25 | Disposition: A | Discharge status: Home / Self Care | Payer: BC Managed Care – PPO | Attending: Physician Assistant | Admitting: Physician Assistant

## 2023-05-25 DIAGNOSIS — M25561 Pain in right knee: Secondary | ICD-10-CM

## 2023-05-25 DIAGNOSIS — S8391XA Sprain of unspecified site of right knee, initial encounter: Secondary | ICD-10-CM

## 2023-05-25 DIAGNOSIS — S8991XA Unspecified injury of right lower leg, initial encounter: Secondary | ICD-10-CM

## 2023-05-25 NOTE — ED Triage Notes (Signed)
Pt c/o RT knee pain onset yesterday, pt states he stepped in a hole while mowing lawn, pt having pain all over RT knee

## 2023-05-25 NOTE — ED Provider Notes (Signed)
[facility]    CSN: [id number] Arrival date & time: 05/25/23  0901      History   Chief Complaint Chief Complaint  Patient presents with   Knee Injury    RT knee    HPI Mario Mcgee is a 52 y.o. male presenting for right knee pain after stepping a hole when mowing his lawn yesterday.  He states that he twisted the knee and then fell.  He says he has pain all over the knee. Denies instability, erythema, contusions, abrasions, wounds. No numbness/tingling.  He does report a history of arthritis in his knees and also states that he has partially torn ligaments in his knee before.  Last MRI was about a year ago.  He does state that his pain is about a 7 out of 10 and feels like a really bad sprain.  He reports that he has had worsening pain before.  Has not taken any OTC meds or applied ice. No other concerns or injuries.  HPI  History reviewed. No pertinent past medical history.  There are no problems to display for this patient.   Past Surgical History:  Procedure Laterality Date   NO PAST SURGERIES         Home Medications    Prior to Admission medications   Medication Sig Start Date End Date Taking? Authorizing Provider  HYDROcodone-acetaminophen (NORCO/VICODIN) 5-325 MG tablet Take 2 tablets by mouth every 6 (six) hours as needed for moderate pain or severe pain. 09/11/20   Loleta Rose, MD  ketorolac (TORADOL) 10 MG tablet Take 1 tablet (10 mg total) by mouth every 6 (six) hours as needed for moderate pain or severe pain. 05/19/20   Tommie Sams, DO    Family History Family History  Problem Relation Age of Onset   Healthy Mother    Liver disease Father     Social History Social History   Tobacco Use   Smoking status: Former   Smokeless tobacco: Never  Building services engineer Use: Never used  Substance Use Topics   Alcohol use: Yes    Comment: "few" glasses of wine or beer nightly    Drug use: No     Allergies   Patient has no known  allergies.   Review of Systems Review of Systems  Musculoskeletal:  Positive for arthralgias and joint swelling. Negative for gait problem.  Skin:  Negative for color change and wound.  Neurological:  Negative for weakness and numbness.     Physical Exam Triage Vital Signs ED Triage Vitals  Enc Vitals Group     BP 05/25/23 0912 (!) 143/87     Pulse Rate 05/25/23 0912 77     Resp --      Temp 05/25/23 0912 98.2 F (36.8 C)     Temp Source 05/25/23 0912 Oral     SpO2 05/25/23 0912 95 %     Weight --      Height --      Head Circumference --      Peak Flow --      Pain Score 05/25/23 0911 7     Pain Loc --      Pain Edu? --      Excl. in GC? --    No data found.  Updated Vital Signs BP (!) 143/87 (BP Location: Left Arm)   Pulse 77   Temp 98.2 F (36.8 C) (Oral)   SpO2 95%     Physical Exam Vitals  and nursing note reviewed.  Constitutional:      General: He is not in acute distress.    Appearance: Normal appearance. He is well-developed. He is not ill-appearing.  HENT:     Head: Normocephalic and atraumatic.  Eyes:     General: No scleral icterus.    Conjunctiva/sclera: Conjunctivae normal.  Cardiovascular:     Rate and Rhythm: Normal rate and regular rhythm.  Pulmonary:     Effort: Pulmonary effort is normal. No respiratory distress.  Musculoskeletal:     Cervical back: Neck supple.     Right knee: Swelling (mild, medial knee) present. Normal range of motion. Tenderness present over the medial joint line and lateral joint line. Normal pulse.     Comments: Diffuse tenderness to palpation of the right anterior and posterior knee.  He has quite a bit of guarding when I palpate his knee.  Increased discomfort with full extension and full flexion of knee.  No instability noted.. No instability.   Skin:    General: Skin is warm and dry.     Capillary Refill: Capillary refill takes less than 2 seconds.  Neurological:     General: No focal deficit present.      Mental Status: He is alert. Mental status is at baseline.     Motor: No weakness.     Gait: Gait abnormal.  Psychiatric:        Mood and Affect: Mood normal.        Behavior: Behavior normal.      UC Treatments / Results  Labs (all labs ordered are listed, but only abnormal results are displayed) Labs Reviewed - No data to display  EKG   Radiology DG Knee Complete 4 Views Right  Result Date: 05/25/2023 CLINICAL DATA:  Larey Seat in a hole.  Right knee pain. EXAM: RIGHT KNEE - COMPLETE 4+ VIEW COMPARISON:  None Available. FINDINGS: Tiny amount of joint fluid. Medial weight-bearing compartment joint space narrowing. Lateral compartment appears intact. Mild joint space narrowing at the patellofemoral joint. No evidence of regional fracture. IMPRESSION: No acute or traumatic finding. Medial weight-bearing compartment joint space narrowing. Mild patellofemoral joint space narrowing. Electronically Signed   By: Paulina Fusi M.D.   On: 05/25/2023 10:22    Procedures Procedures (including critical care time)  Medications Ordered in UC Medications - No data to display  Initial Impression / Assessment and Plan / UC Course  I have reviewed the triage vital signs and the nursing notes.  Pertinent labs & imaging results that were available during my care of the patient were reviewed by me and considered in my medical decision making (see chart for details).   52 year old male presents for right knee pain after falling in a hole yesterday when mowing the lawn.  X-ray of right knee obtained.  X-ray does not show any acute injury.  It does show mild joint space narrowing.  Reviewed results of x-ray with patient.  Suspect sprain of the knee but advised patient I cannot rule out ligamentous or meniscus tears at this time.  He has a knee brace at home he is going to wear.  Reviewed RICE guidelines, ibuprofen and Tylenol for pain relief.  Reviewed return precautions.  Reviewed following up with  orthopedics if no improvement over the next week or symptoms worsen.   Final Clinical Impressions(s) / UC Diagnoses   Final diagnoses:  Acute pain of right knee  Sprain of right knee, unspecified ligament, initial encounter  Injury of right knee,  initial encounter     Discharge Instructions      -X-ray does not show any fractures.  -As discussed, x-rays cannot evaluate for ligament or meniscus tears. Possible sprain. - Wear your knee brace, ice and elevate your knee.  Take ibuprofen and Tylenol as needed for discomfort.  If no improvement in the next week or if symptoms worsen then please follow-up with orthopedics for further evaluation.  You have a condition requiring you to follow up with Orthopedics so please call one of the following office for appointment:   [facility] Address: [address], [place], [place] [address] Phone: [phone]  [facility] [address] [place], [place], [place] [address] Phone: [phone]  [facility] [address], [place], [place] [address] Phone: [phone]      ED Prescriptions   None    I have reviewed the PDMP during this encounter.   Shirlee Latch, PA-C 05/25/23 1032

## 2023-05-25 NOTE — Discharge Instructions (Addendum)
-  X-ray does not show any fractures.  -As discussed, x-rays cannot evaluate for ligament or meniscus tears. Possible sprain. - Wear your knee brace, ice and elevate your knee.  Take ibuprofen and Tylenol as needed for discomfort.  If no improvement in the next week or if symptoms worsen then please follow-up with orthopedics for further evaluation.  You have a condition requiring you to follow up with Orthopedics so please call one of the following office for appointment:   [facility] Address: [address] [place], [place] [address] Phone: [phone]  [facility] [address] [place], [place] [address] Phone: [phone]  [facility] [address] [place], [place] [address] Phone: [phone]

## 2024-10-17 ENCOUNTER — Emergency Department

## 2024-10-17 ENCOUNTER — Inpatient Hospital Stay
Admission: EM | Admit: 2024-10-17 | Discharge: 2024-10-19 | DRG: 159 | Disposition: A | Discharge status: Home / Self Care | Source: Ambulatory Visit | Attending: Internal Medicine | Admitting: Internal Medicine

## 2024-10-17 ENCOUNTER — Ambulatory Visit: Admission: EM | Admit: 2024-10-17 | Discharge: 2024-10-17

## 2024-10-17 ENCOUNTER — Other Ambulatory Visit: Payer: Self-pay

## 2024-10-17 DIAGNOSIS — K0889 Other specified disorders of teeth and supporting structures: Secondary | ICD-10-CM

## 2024-10-17 DIAGNOSIS — R221 Localized swelling, mass and lump, neck: Secondary | ICD-10-CM | POA: Diagnosis not present

## 2024-10-17 DIAGNOSIS — Z87891 Personal history of nicotine dependence: Secondary | ICD-10-CM

## 2024-10-17 DIAGNOSIS — K122 Cellulitis and abscess of mouth: Secondary | ICD-10-CM | POA: Diagnosis present

## 2024-10-17 DIAGNOSIS — K047 Periapical abscess without sinus: Secondary | ICD-10-CM | POA: Diagnosis present

## 2024-10-17 DIAGNOSIS — R252 Cramp and spasm: Secondary | ICD-10-CM | POA: Diagnosis not present

## 2024-10-17 DIAGNOSIS — R220 Localized swelling, mass and lump, head: Secondary | ICD-10-CM | POA: Diagnosis not present

## 2024-10-17 HISTORY — DX: Attention-deficit hyperactivity disorder, unspecified type: F90.9

## 2024-10-17 LAB — CBC WITH DIFFERENTIAL/PLATELET
Abs Immature Granulocytes: 0.05 K/uL (ref 0.00–0.07)
Basophils Absolute: 0 K/uL (ref 0.0–0.1)
Basophils Relative: 0 %
Eosinophils Absolute: 0 K/uL (ref 0.0–0.5)
Eosinophils Relative: 0 %
HCT: 45.1 % (ref 39.0–52.0)
Hemoglobin: 15.5 g/dL (ref 13.0–17.0)
Immature Granulocytes: 1 %
Lymphocytes Relative: 10 %
Lymphs Abs: 0.9 K/uL (ref 0.7–4.0)
MCH: 32.7 pg (ref 26.0–34.0)
MCHC: 34.4 g/dL (ref 30.0–36.0)
MCV: 95.1 fL (ref 80.0–100.0)
Monocytes Absolute: 0.9 K/uL (ref 0.1–1.0)
Monocytes Relative: 10 %
Neutro Abs: 7.5 K/uL (ref 1.7–7.7)
Neutrophils Relative %: 79 %
Platelets: 220 K/uL (ref 150–400)
RBC: 4.74 MIL/uL (ref 4.22–5.81)
RDW: 11.9 % (ref 11.5–15.5)
WBC: 9.5 K/uL (ref 4.0–10.5)
nRBC: 0 % (ref 0.0–0.2)

## 2024-10-17 LAB — HIV ANTIBODY (ROUTINE TESTING W REFLEX): HIV Screen 4th Generation wRfx: NONREACTIVE

## 2024-10-17 LAB — LIPID PANEL
Cholesterol: 154 mg/dL (ref 0–200)
HDL: 66 mg/dL (ref >40)
LDL Cholesterol: 71 mg/dL (ref 0–99)
Total CHOL/HDL Ratio: 2.3 ratio
Triglycerides: 87 mg/dL (ref <150)
VLDL: 17 mg/dL (ref 0–40)

## 2024-10-17 LAB — COMPREHENSIVE METABOLIC PANEL WITH GFR
ALT: 37 U/L (ref 0–44)
AST: 32 U/L (ref 15–41)
Albumin: 3.8 g/dL (ref 3.5–5.0)
Alkaline Phosphatase: 57 U/L (ref 38–126)
Anion gap: 13 (ref 5–15)
BUN: 9 mg/dL (ref 6–20)
CO2: 25 mmol/L (ref 22–32)
Calcium: 9 mg/dL (ref 8.9–10.3)
Chloride: 99 mmol/L (ref 98–111)
Creatinine, Ser: 0.83 mg/dL (ref 0.61–1.24)
GFR, Estimated: >60 mL/min (ref >60)
Glucose, Bld: 120 mg/dL — ABNORMAL HIGH (ref 70–99)
Potassium: 3.9 mmol/L (ref 3.5–5.1)
Sodium: 137 mmol/L (ref 135–145)
Total Bilirubin: 2.2 mg/dL — ABNORMAL HIGH (ref 0.0–1.2)
Total Protein: 8 g/dL (ref 6.5–8.1)

## 2024-10-17 LAB — MRSA NEXT GEN BY PCR, NASAL: MRSA by PCR Next Gen: NOT DETECTED

## 2024-10-17 LAB — HEMOGLOBIN A1C
Hgb A1c MFr Bld: 5.2 % (ref 4.8–5.6)
Mean Plasma Glucose: 102.54 mg/dL

## 2024-10-17 LAB — MAGNESIUM: Magnesium: 2.3 mg/dL (ref 1.7–2.4)

## 2024-10-17 LAB — GLUCOSE, CAPILLARY: Glucose-Capillary: 132 mg/dL — ABNORMAL HIGH (ref 70–99)

## 2024-10-17 MED ORDER — ACETAMINOPHEN 650 MG RE SUPP: 650.0000 mg | Freq: Four times a day (QID) | RECTAL | Status: DC | PRN

## 2024-10-17 MED ORDER — DEXAMETHASONE SOD PHOSPHATE PF 10 MG/ML IJ SOLN
10.0000 mg | Freq: Once | INTRAMUSCULAR | Status: AC
  Administered 2024-10-17: 10 mg via INTRAVENOUS

## 2024-10-17 MED ORDER — VANCOMYCIN HCL 2000 MG/400ML IV SOLN
2000.0000 mg | Freq: Once | INTRAVENOUS | Status: AC
  Administered 2024-10-17: 2000 mg via INTRAVENOUS
  Filled 2024-10-17: qty 400

## 2024-10-17 MED ORDER — MORPHINE SULFATE (PF) 4 MG/ML IV SOLN
4.0000 mg | Freq: Once | INTRAVENOUS | Status: AC
  Administered 2024-10-17: 4 mg via INTRAVENOUS
  Filled 2024-10-17: qty 1

## 2024-10-17 MED ORDER — KETOROLAC TROMETHAMINE 15 MG/ML IJ SOLN
15.0000 mg | Freq: Four times a day (QID) | INTRAMUSCULAR | Status: DC
  Administered 2024-10-17 – 2024-10-19 (×7): 15 mg via INTRAVENOUS
  Filled 2024-10-17 (×7): qty 1

## 2024-10-17 MED ORDER — VANCOMYCIN HCL 1500 MG/300ML IV SOLN
1500.0000 mg | Freq: Two times a day (BID) | INTRAVENOUS | Status: DC
  Administered 2024-10-18: 1500 mg via INTRAVENOUS
  Filled 2024-10-17 (×2): qty 300

## 2024-10-17 MED ORDER — ACETAMINOPHEN 325 MG PO TABS
650.0000 mg | ORAL_TABLET | Freq: Four times a day (QID) | ORAL | Status: DC | PRN
  Filled 2024-10-17: qty 2

## 2024-10-17 MED ORDER — SODIUM CHLORIDE 0.9 % IV SOLN
3.0000 g | Freq: Once | INTRAVENOUS | Status: AC
  Administered 2024-10-17: 3 g via INTRAVENOUS
  Filled 2024-10-17: qty 8

## 2024-10-17 MED ORDER — HEPARIN SODIUM (PORCINE) 5000 UNIT/ML IJ SOLN
5000.0000 [IU] | Freq: Three times a day (TID) | INTRAMUSCULAR | Status: DC
  Filled 2024-10-17 (×3): qty 1

## 2024-10-17 MED ORDER — DEXAMETHASONE SODIUM PHOSPHATE 4 MG/ML IJ SOLN
4.0000 mg | Freq: Two times a day (BID) | INTRAMUSCULAR | Status: DC
  Administered 2024-10-18 – 2024-10-19 (×3): 4 mg via INTRAVENOUS
  Filled 2024-10-17 (×3): qty 1

## 2024-10-17 MED ORDER — SODIUM CHLORIDE 0.9 % IV SOLN
3.0000 g | Freq: Four times a day (QID) | INTRAVENOUS | Status: DC
  Administered 2024-10-17 – 2024-10-19 (×6): 3 g via INTRAVENOUS
  Filled 2024-10-17 (×8): qty 8

## 2024-10-17 MED ORDER — HYDROMORPHONE HCL 1 MG/ML IJ SOLN
0.5000 mg | INTRAMUSCULAR | Status: DC | PRN
  Administered 2024-10-17 – 2024-10-19 (×11): 1 mg via INTRAVENOUS
  Filled 2024-10-17 (×12): qty 1

## 2024-10-17 MED ORDER — OXYCODONE HCL 5 MG PO TABS
5.0000 mg | ORAL_TABLET | ORAL | Status: DC | PRN
  Administered 2024-10-17 – 2024-10-19 (×7): 5 mg via ORAL
  Filled 2024-10-17 (×7): qty 1

## 2024-10-17 MED ORDER — CHLORHEXIDINE GLUCONATE CLOTH 2 % EX PADS
6.0000 | MEDICATED_PAD | Freq: Every day | CUTANEOUS | Status: DC
  Administered 2024-10-18: 6 via TOPICAL

## 2024-10-17 MED ORDER — IOHEXOL 300 MG/ML  SOLN
75.0000 mL | Freq: Once | INTRAMUSCULAR | Status: AC | PRN
  Administered 2024-10-17: 75 mL via INTRAVENOUS

## 2024-10-17 MED ORDER — SENNOSIDES-DOCUSATE SODIUM 8.6-50 MG PO TABS: 1.0000 | ORAL_TABLET | Freq: Every evening | ORAL | Status: DC | PRN

## 2024-10-17 MED ORDER — SODIUM CHLORIDE 0.9% FLUSH
3.0000 mL | Freq: Two times a day (BID) | INTRAVENOUS | Status: DC
  Administered 2024-10-17 – 2024-10-19 (×5): 3 mL via INTRAVENOUS

## 2024-10-17 NOTE — Discharge Instructions (Addendum)
 As we discussed, you need imaging of your neck to determine if you have an abscess from either your tooth or from one of your tonsils.  Given you cannot fully open your mouth this is a potentially life-threatening emergency.  Please go to [facility] to be evaluated.

## 2024-10-17 NOTE — ED Notes (Signed)
 Gave report to RN for ICU room 17

## 2024-10-17 NOTE — Consult Note (Signed)
 Mario Mcgee, Mario Mcgee [id number] 1971-09-21 Mario GORMAN Dolly, MD  Reason for Consult: dental infection  Requesting Physician: No att. providers found Consulting Physician: Mario GORMAN Dolly, MD  HPI: This 53 y.o. 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 Patient presented to ER for evaluation of jaw pain and swelling.  Patient reports he recently had a cracked tooth that he saw dentist for.  Is scheduled to be extracted tomorrow.  Over the past 24 hours he has had significant worsening and swelling along his left jaw with some painful swallowing.  No fevers.  No difficulty breathing and he feels like symptoms are little better since receiving antibiotics and steroids here.  Medications:  Current Facility-Administered Medications  Medication Dose Route Frequency Provider Last Rate Last Admin   acetaminophen  (TYLENOL ) tablet 650 mg  650 mg Oral Q6H PRN Fernand Prost, MD       Or   acetaminophen  (TYLENOL ) suppository 650 mg  650 mg Rectal Q6H PRN Khan, Ghalib, MD       heparin injection 5,000 Units  5,000 Units Subcutaneous Q8H Khan, Ghalib, MD       HYDROmorphone (DILAUDID) injection 0.5-1 mg  0.5-1 mg Intravenous Q2H PRN Khan, Ghalib, MD   1 mg at 10/17/24 1543   oxyCODONE (Oxy IR/ROXICODONE) immediate release tablet 5 mg  5 mg Oral Q4H PRN Khan, Ghalib, MD       senna-docusate (Senokot-S) tablet 1 tablet  1 tablet Oral QHS PRN Khan, Ghalib, MD       sodium chloride flush (NS) 0.9 % injection 3 mL  3 mL Intravenous Q12H Khan, Ghalib, MD   3 mL at 10/17/24 1544   [START ON 10/18/2024] vancomycin (VANCOREADY) IVPB 1500 mg/300 mL  1,500 mg Intravenous Q12H Chappell, Alex B, RPH       vancomycin (VANCOREADY) IVPB 2000 mg/400 mL  2,000 mg Intravenous Once Chappell, Alex B, RPH 200 mL/hr at 10/17/24 1628 2,000 mg at 10/17/24 1628  .  Medications Prior to Admission  Medication Sig Dispense Refill   amphetamine-dextroamphetamine (ADDERALL) 5 MG tablet  Take 1 tablet by mouth daily as needed.     amphetamine-dextroamphetamine (ADDERALL) 10 MG tablet Take 10 mg by mouth every morning.     HYDROcodone -acetaminophen  (NORCO/VICODIN) 5-325 MG tablet Take 2 tablets by mouth every 6 (six) hours as needed for moderate pain or severe pain. (Patient not taking: Reported on 10/17/2024) 16 tablet 0   ketorolac  (TORADOL ) 10 MG tablet Take 1 tablet (10 mg total) by mouth every 6 (six) hours as needed for moderate pain or severe pain. (Patient not taking: Reported on 10/17/2024) 20 tablet 0    Allergies: No Known Allergies  PMH:  Past Medical History:  Diagnosis Date   ADHD     Fam Hx:  Family History  Problem Relation Age of Onset   Healthy Mother    Liver disease Father     Soc Hx:  Social History   Socioeconomic History   Marital status: Married    Spouse name: Not on file   Number of children: Not on file   Years of education: Not on file   Highest education level: Not on file  Occupational History   Not on file  Tobacco Use   Smoking status: Former   Smokeless tobacco: Never  Vaping Use   Vaping status: Never Used  Substance and Sexual Activity   Alcohol use: Yes    Comment: few glasses of wine or beer  nightly    Drug use: No   Sexual activity: Not on file  Other Topics Concern   Not on file  Social History Narrative   Not on file   Social Drivers of Health   Financial Resource Strain: Not on file  Food Insecurity: Not on file  Transportation Needs: Not on file  Physical Activity: Not on file  Stress: Not on file  Social Connections: Not on file  Intimate Partner Violence: Not on file    PSH:  Past Surgical History:  Procedure Laterality Date   NO PAST SURGERIES    . Procedures since admission: No admission procedures for hospital encounter.  ROS: Review of systems normal other than 12 systems except per HPI.  PHYSICAL EXAM  Vitals: Blood pressure 125/89, pulse 87, temperature 98.8 F (37.1 C), temperature  source Oral, resp. rate 18, height 5' 11 (1.803 m), weight 101.1 kg, SpO2 98%.. General: Well-developed, Well-nourished in no acute distress Mood: Mood and affect well adjusted, pleasant and cooperative. Orientation: Grossly alert and oriented. Vocal Quality: No hoarseness. Communicates verbally. head and Face: NCAT. No facial asymmetry. No visible skin lesions. No significant facial scars. No tenderness with sinus percussion. Facial strength normal and symmetric. Ears: External ears with normal landmarks, no lesions.  Hearing: Speech reception grossly normal. Nose: External nose normal with midline dorsum and no lesions or deformity. Nasal Cavity reveals essentially midline septum with normal inferior turbinates. No significant mucosal congestion or erythema. Nasal secretions are minimal and clear. No polyps seen on anterior rhinoscopy. Oral Cavity/ Oropharynx: Lips are normal with no lesions. Teeth no frank dental caries, but back left mandibular molar is a little discolored.  No obvious swelling around the tooth or any visible swelling in the floor of the mouth. Gingiva healthy with no lesions or gingivitis. Oropharynx including tongue, buccal mucosa, floor of mouth, hard and soft palate, uvula and posterior pharynx free of exudates, erythema or lesions with normal symmetry and hydration.  Indirect Laryngoscopy/Nasopharyngoscopy: Visualization of the larynx, hypopharynx and nasopharynx is not possible in this setting with routine examination. Neck: Supple with tenderness and swelling, firm edema of the left submandibular triangle.  No fluctuance.  No erythema or crepitance of the overlying skin.   Lymphatic: Cervical lymph nodes are without palpable lymphadenopathy other than the left submandibular swelling noted. Respiratory: Normal respiratory effort without labored breathing.  No stridor Cardiovascular: Carotid pulse shows regular rate and rhythm Neurologic: Cranial Nerves II through XII are  grossly intact. Eyes: Gaze and Ocular Motility are grossly normal. PERRLA. No visible nystagmus.  MEDICAL DECISION MAKING: Data Review:  Results for orders placed or performed during the hospital encounter of 10/17/24 (from the past 48 hours)  CBC with Differential     Status: None   Collection Time: 10/17/24 11:29 AM  Result Value Ref Range   WBC 9.5 4.0 - 10.5 K/uL   RBC 4.74 4.22 - 5.81 MIL/uL   Hemoglobin 15.5 13.0 - 17.0 g/dL   HCT 54.8 60.9 - 47.9 %   MCV 95.1 80.0 - 100.0 fL   MCH 32.7 26.0 - 34.0 pg   MCHC 34.4 30.0 - 36.0 g/dL   RDW 88.0 88.4 - 84.4 %   Platelets 220 150 - 400 K/uL   nRBC 0.0 0.0 - 0.2 %   Neutrophils Relative % 79 %   Neutro Abs 7.5 1.7 - 7.7 K/uL   Lymphocytes Relative 10 %   Lymphs Abs 0.9 0.7 - 4.0 K/uL   Monocytes Relative 10 %  Monocytes Absolute 0.9 0.1 - 1.0 K/uL   Eosinophils Relative 0 %   Eosinophils Absolute 0.0 0.0 - 0.5 K/uL   Basophils Relative 0 %   Basophils Absolute 0.0 0.0 - 0.1 K/uL   Immature Granulocytes 1 %   Abs Immature Granulocytes 0.05 0.00 - 0.07 K/uL    Comment: Performed at [facility], [address] [place], [place] [address]  Comprehensive metabolic panel     Status: Abnormal   Collection Time: 10/17/24 11:29 AM  Result Value Ref Range   Sodium 137 135 - 145 mmol/L   Potassium 3.9 3.5 - 5.1 mmol/L   Chloride 99 98 - 111 mmol/L   CO2 25 22 - 32 mmol/L   Glucose, Bld 120 (H) 70 - 99 mg/dL    Comment: Glucose reference range applies only to samples taken after fasting for at least 8 hours.   BUN 9 6 - 20 mg/dL   Creatinine, Ser 9.16 0.61 - 1.24 mg/dL   Calcium 9.0 8.9 - 89.6 mg/dL   Total Protein 8.0 6.5 - 8.1 g/dL   Albumin 3.8 3.5 - 5.0 g/dL   AST 32 15 - 41 U/L   ALT 37 0 - 44 U/L   Alkaline Phosphatase 57 38 - 126 U/L   Total Bilirubin 2.2 (H) 0.0 - 1.2 mg/dL   GFR, Estimated >39 >39 mL/min    Comment: (NOTE) Calculated using the CKD-EPI Creatinine Equation (2021)    Anion gap 13 5 - 15     Comment: Performed at [facility], [address] [place], [place] [address]  Lipid panel     Status: None   Collection Time: 10/17/24 11:29 AM  Result Value Ref Range   Cholesterol 154 0 - 200 mg/dL   Triglycerides 87 <849 mg/dL   HDL 66 >59 mg/dL   Total CHOL/HDL Ratio 2.3 RATIO   VLDL 17 0 - 40 mg/dL   LDL Cholesterol 71 0 - 99 mg/dL    Comment:        Total Cholesterol/HDL:CHD Risk Coronary Heart Disease Risk Table                     Men   Women  1/2 Average Risk   3.4   3.3  Average Risk       5.0   4.4  2 X Average Risk   9.6   7.1  3 X Average Risk  23.4   11.0        Use the calculated Patient Ratio above and the CHD Risk Table to determine the patient's CHD Risk.        ATP III CLASSIFICATION (LDL):  <100     mg/dL   Optimal  899-870  mg/dL   Near or Above                    Optimal  130-159  mg/dL   Borderline  839-810  mg/dL   High  >809     mg/dL   Very High Performed at [facility], [address] [place], [place] [address]   Magnesium     Status: None   Collection Time: 10/17/24 11:29 AM  Result Value Ref Range   Magnesium 2.3 1.7 - 2.4 mg/dL    Comment: Performed at [facility], [address] [place], [place] [address]  . CT Soft Tissue Neck W Contrast Result Date: 10/17/2024 EXAM: CT NECK WITH CONTRAST 10/17/2024 01:03:16  PM TECHNIQUE: CT of the neck was performed with the administration of 75 mL of iohexol (OMNIPAQUE) 300 MG/ML solution. Multiplanar reformatted images are provided for review. Automated exposure control, iterative reconstruction, and/or weight based adjustment of the mA/kV was utilized to reduce the radiation dose to as low as reasonably achievable. COMPARISON: None available. CLINICAL HISTORY: 53 year old male. Facial and left submandibular swelling, eval abscess. FINDINGS: AERODIGESTIVE TRACT: No discrete mass. There is mild asymmetric edema along the left lateral wall of the pharynx. The retropharyngeal  and right parapharyngeal spaces appear more normal. Nasopharynx is negative. Negative visible subglottic airway. SALIVARY GLANDS: The left submandibular gland is asymmetrically enlarged and indistinct. There is no left submandibular duct dilatation identified. The left sublingual space is abnormal. The contralateral right sublingual and submandibular spaces are negative. The parotid glands are unremarkable. THYROID: Unremarkable. LYMPH NODES: Small but reactive appearing left level 1 and level 2 lymph nodes, individually up to 8 mm short axis. SOFT TISSUES: There is confluent inflammatory stranding in the left parapharyngeal space. There is an abnormal rim enhancing low density area tracking in the left mylohyoid muscle, best seen on coronal image 53 and also series 2 images 62 through 67. This lesion measures about 5 x 9 x 16 mm (AP x transverse x CC). The estimated volume of an ellipsoid from these measurements is 0 mL. There is asymmetric subperiosteal hypodensity along the posterior body of the left mandible, and this corresponds to the level of an impacted left mandible wisdom tooth. No mandible cortical dehiscence is identified. However, the hypodensity appears to track from that area in a relatively linear fashion into the abnormal left mylohyoid muscle. The central sublingual space remains normal. The anterior left sublingual space is inflamed. There is mild inflammation in the left lower masticator space. The upper left masticator space, left parotid space, and right masticator space are negative. Carious contralateral right mandible anterior molar. Major vascular structures in the bilateral neck and skull base are enhancing and patent. BRAIN, ORBITS, SINUSES AND MASTOIDS: No acute abnormality. Visible paranasal sinuses, middle ears and mastoids are well aerated. LUNGS AND MEDIASTINUM: No acute abnormality. Negative visible upper lungs and superior mediastinum. BONES: Ordinary cervical spine degeneration.  IMPRESSION: 1. Multispatial inflammation centered at the left submandibular and sublingual spaces; constellation strongly suggests this is complication from a posterior mandible dental infection - with subperiosteal edema/phlegmon along the posterior body of the left mandible, and a small or developing intramuscular abscess (estimated 1 mL) tracking through the left mylohyoid muscle. But no dental periapical dehiscence is associated. 2. Secondary inflammation of the left submandibular gland, also in the left lower masticator and the left parapharyngeal spaces. Small reactive left level 1 and level 2 lymph nodes. Electronically signed by: Helayne Hurst MD 10/17/2024 01:52 PM EST RP Workstation: [id number]  .   ASSESSMENT: Dental infection with secondary cellulitis of the adjacent submandibular space.  There is a small subperiosteal phlegmon or early abscess, and a even smaller phlegmon or early abscess around the region of the mylohyoid muscle.  No large fluid collections seen. PLAN: Recommend close observation and treatment with IV antibiotics.  Unasyn should provide excellent coverage.  Can continue Decadron for control of edema.  Monitoring in the CCU is certainly reasonable although he does not appear to be in any respiratory distress and there is no significant floor of mouth edema.  Most of the edema appears to be in the soft tissues of the neck, not the immediate floor of the mouth.  Will  follow.  As his trismus improves and the swelling goes down he can be discharged to return to his dentist for dental extraction.   Mario GORMAN Dolly, MD 10/17/2024 4:54 PM

## 2024-10-17 NOTE — ED Provider Notes (Signed)
 [facility] Provider Note    Event Date/Time   First MD Initiated Contact with Patient 10/17/24 1024     (approximate)   History   Dental Pain   HPI  Mario Mcgee is a 53 year old male presenting to the emergency department for evaluation of jaw pain and swelling.  Patient reports he recently had a cracked tooth that he saw dentist for.  Is scheduled to be extracted tomorrow.  Over the past 24 hours he has had significant worsening and swelling along his jaw with some painful swallowing.  No fevers.     Physical Exam   Triage Vital Signs: ED Triage Vitals  Encounter Vitals Group     BP 10/17/24 1016 (!) 134/94     Girls Systolic BP Percentile --      Girls Diastolic BP Percentile --      Boys Systolic BP Percentile --      Boys Diastolic BP Percentile --      Pulse Rate 10/17/24 1016 92     Resp 10/17/24 1016 17     Temp 10/17/24 1016 98.1 F (36.7 C)     Temp Source 10/17/24 1016 Oral     SpO2 10/17/24 1016 100 %     Weight 10/17/24 1015 224 lb 6.4 oz (101.8 kg)     Height --      Head Circumference --      Peak Flow --      Pain Score 10/17/24 1014 10     Pain Loc --      Pain Education --      Exclude from Growth Chart --     Most recent vital signs: Vitals:   10/17/24 1330 10/17/24 1345  BP: (!) 146/98 (!) 128/99  Pulse: 81 83  Resp:  18  Temp:  98 F (36.7 C)  SpO2: 98% 96%     General: Awake, interactive  HEENT: Prominent left submandibular swelling noted on exam.  There is no swelling underneath the tongue.  Patient is managing his secretions without issue.  Patient's mouth opening initially limited due to pain, on reevaluation able to get better exam.  He has touch tenderness to palpation over his left, no visible periapical abscess mandibular molar, overall fair dentition CV:  Good peripheral perfusion Resp:  Unlabored respirations, lungs clear to auscultation Abd:  Nondistended.  Neuro:  Symmetric facial movement, fluid  speech   ED Results / Procedures / Treatments   Labs (all labs ordered are listed, but only abnormal results are displayed) Labs Reviewed  COMPREHENSIVE METABOLIC PANEL WITH GFR - Abnormal; Notable for the following components:      Result Value   Glucose, Bld 120 (*)    Total Bilirubin 2.2 (*)    All other components within normal limits  CBC WITH DIFFERENTIAL/PLATELET     EKG EKG independently reviewed and interpreted by myself demonstrates:    RADIOLOGY Imaging independently reviewed and interpreted by myself demonstrates:  CT of the neck demonstrates inflammation of the left submandibular and sublingual area likely related to a posterior mandible dental infection.  A small phlegmon versus abscess noted in the left mylohyoid muscle with secondary inflammation  Formal Radiology Read:  CT Soft Tissue Neck W Contrast Result Date: 10/17/2024 EXAM: CT NECK WITH CONTRAST 10/17/2024 01:03:16 PM TECHNIQUE: CT of the neck was performed with the administration of 75 mL of iohexol (OMNIPAQUE) 300 MG/ML solution. Multiplanar reformatted images are provided for review. Automated exposure control, iterative reconstruction,  and/or weight based adjustment of the mA/kV was utilized to reduce the radiation dose to as low as reasonably achievable. COMPARISON: None available. CLINICAL HISTORY: 53 year old male. Facial and left submandibular swelling, eval abscess. FINDINGS: AERODIGESTIVE TRACT: No discrete mass. There is mild asymmetric edema along the left lateral wall of the pharynx. The retropharyngeal and right parapharyngeal spaces appear more normal. Nasopharynx is negative. Negative visible subglottic airway. SALIVARY GLANDS: The left submandibular gland is asymmetrically enlarged and indistinct. There is no left submandibular duct dilatation identified. The left sublingual space is abnormal. The contralateral right sublingual and submandibular spaces are negative. The parotid glands are  unremarkable. THYROID: Unremarkable. LYMPH NODES: Small but reactive appearing left level 1 and level 2 lymph nodes, individually up to 8 mm short axis. SOFT TISSUES: There is confluent inflammatory stranding in the left parapharyngeal space. There is an abnormal rim enhancing low density area tracking in the left mylohyoid muscle, best seen on coronal image 53 and also series 2 images 62 through 67. This lesion measures about 5 x 9 x 16 mm (AP x transverse x CC). The estimated volume of an ellipsoid from these measurements is 0 mL. There is asymmetric subperiosteal hypodensity along the posterior body of the left mandible, and this corresponds to the level of an impacted left mandible wisdom tooth. No mandible cortical dehiscence is identified. However, the hypodensity appears to track from that area in a relatively linear fashion into the abnormal left mylohyoid muscle. The central sublingual space remains normal. The anterior left sublingual space is inflamed. There is mild inflammation in the left lower masticator space. The upper left masticator space, left parotid space, and right masticator space are negative. Carious contralateral right mandible anterior molar. Major vascular structures in the bilateral neck and skull base are enhancing and patent. BRAIN, ORBITS, SINUSES AND MASTOIDS: No acute abnormality. Visible paranasal sinuses, middle ears and mastoids are well aerated. LUNGS AND MEDIASTINUM: No acute abnormality. Negative visible upper lungs and superior mediastinum. BONES: Ordinary cervical spine degeneration. IMPRESSION: 1. Multispatial inflammation centered at the left submandibular and sublingual spaces; constellation strongly suggests this is complication from a posterior mandible dental infection - with subperiosteal edema/phlegmon along the posterior body of the left mandible, and a small or developing intramuscular abscess (estimated 1 mL) tracking through the left mylohyoid muscle. But no  dental periapical dehiscence is associated. 2. Secondary inflammation of the left submandibular gland, also in the left lower masticator and the left parapharyngeal spaces. Small reactive left level 1 and level 2 lymph nodes. Electronically signed by: Helayne Hurst MD 10/17/2024 01:52 PM EST RP Workstation: [id number]    PROCEDURES:  Critical Care performed: No  Procedures   MEDICATIONS ORDERED IN ED: Medications  dexamethasone (DECADRON) injection 10 mg (has no administration in time range)  morphine (PF) 4 MG/ML injection 4 mg (4 mg Intravenous Given 10/17/24 1123)  iohexol (OMNIPAQUE) 300 MG/ML solution 75 mL (75 mLs Intravenous Contrast Given 10/17/24 1255)  morphine (PF) 4 MG/ML injection 4 mg (4 mg Intravenous Given 10/17/24 1315)  Ampicillin-Sulbactam (UNASYN) 3 g in sodium chloride 0.9 % 100 mL IVPB (0 g Intravenous Stopped 10/17/24 1443)     IMPRESSION / MDM / ASSESSMENT AND PLAN / ED COURSE  I reviewed the triage vital signs and the nursing notes.  Differential diagnosis includes, but is not limited to, dental infection, facial cellulitis, abscess, reactive lymphadenopathy, physical exam less suggestive of Ludwig's angina  Patient's presentation is most consistent with acute presentation with potential threat to  life or bodily function.  53 year old male presenting to the emergency department for evaluation of jaw pain and swelling.  Does have noted submandibular swelling on exam but maintaining airway.  Reassuring vitals.  Labs with reassuring CBC, CMP.  CT ordered to further evaluate which did demonstrate extensive inflammation in this area as well as small area of possible developing abscess in the muscle.  Will review with ENT.  Clinical Course as of 10/17/24 1446  Wed Oct 17, 2024  1358 Reviewed with Dr. Blair with ENT. Recommends admission to hospitalist service for IV antibiotics. Will review CT and provide further recommendations.  [NR]  1408 Received update from Dr.  Blair.  Has reviewed CT scan.  Does recommend steroids.  IV Decadron ordered.  Discussed with patient.  He is agreeable with admission, will contact his dentist to let him know that he is getting admitted. [NR]  1433 Case discussed with hospitalist team.  They will evaluate for anticipate admission. [NR]    Clinical Course User Index [NR] Levander Slate, MD     FINAL CLINICAL IMPRESSION(S) / ED DIAGNOSES   Final diagnoses:  Submandibular space infection     Rx / DC Orders   ED Discharge Orders     None        Note:  This document was prepared using Dragon voice recognition software and may include unintentional dictation errors.   Levander Slate, MD 10/17/24 [phone]

## 2024-10-17 NOTE — ED Notes (Signed)
 Pt to CT

## 2024-10-17 NOTE — ED Triage Notes (Signed)
 Pt c/o dental pain ongoing for a couple days but worsened 3 days ago. States jaw locked up today. Is scheduled to have tooth pulled out tomorrow.

## 2024-10-17 NOTE — ED Triage Notes (Signed)
 Referred to ED from [facility]. C/O worsening left sided jaw pain and swelling, difficulty opening jaw, clicking to jaw and some difficulty swallowing.  Voice clear. Managing secretions. Skin warm and dry.

## 2024-10-17 NOTE — ED Provider Notes (Signed)
 [facility]    CSN: [id number] Arrival date & time: 10/17/24  0903      History   Chief Complaint Chief Complaint  Patient presents with   Dental Pain    HPI Mario Mcgee is a 53 y.o. male.   HPI  54 year old male with past medical history significant for ADHD presents for evaluation of 3 days worth of worsening dental pain as well as painful swallowing, inability to open his mouth, and swelling underneath the left side of his jaw that started this morning.  He denies any fever.  Past Medical History:  Diagnosis Date   ADHD     Patient Active Problem List   Diagnosis Date Noted   Arthritis of right knee 09/20/2022   Swelling of knee joint 09/20/2022    Past Surgical History:  Procedure Laterality Date   NO PAST SURGERIES         Home Medications    Prior to Admission medications   Medication Sig Start Date End Date Taking? Authorizing Provider  amphetamine-dextroamphetamine (ADDERALL) 10 MG tablet Take 10 mg by mouth every morning. 09/19/24  Yes [provider]  HYDROcodone -acetaminophen  (NORCO/VICODIN) 5-325 MG tablet Take 2 tablets by mouth every 6 (six) hours as needed for moderate pain or severe pain. 09/11/20   Gordan Huxley, MD  ketorolac  (TORADOL ) 10 MG tablet Take 1 tablet (10 mg total) by mouth every 6 (six) hours as needed for moderate pain or severe pain. 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   Review of Systems Review of Systems  Constitutional:  Negative for fever.  HENT:  Positive for dental problem and trouble swallowing.  Negative for sore throat.      Physical Exam Triage Vital Signs ED Triage Vitals  Encounter Vitals Group     BP 10/17/24 0921 126/86     Girls Systolic BP Percentile --      Girls Diastolic BP Percentile --      Boys Systolic BP Percentile --      Boys Diastolic BP Percentile --      Pulse Rate 10/17/24 0921 83     Resp 10/17/24 0921 14     Temp 10/17/24 0921 99.1 F (37.3 C)     Temp Source 10/17/24 0921 Oral     SpO2 10/17/24 0921 97 %     Weight 10/17/24 0917 224 lb 6.4 oz (101.8 kg)     Height --      Head Circumference --      Peak Flow --      Pain Score 10/17/24 0919 10     Pain Loc --      Pain Education --      Exclude from Growth Chart --    No data found.  Updated Vital Signs BP 126/86 (BP Location: Left Arm)   Pulse 83   Temp 99.1 F (37.3 C) (Oral)   Resp 14   Wt 224 lb 6.4 oz (  101.8 kg)   SpO2 97%   BMI 31.30 kg/m   Visual Acuity Right Eye Distance:   Left Eye Distance:   Bilateral Distance:    Right Eye Near:   Left Eye Near:    Bilateral Near:     Physical Exam Vitals and nursing note reviewed.  Constitutional:      Appearance: Normal appearance. He is not ill-appearing.  HENT:     Head: Normocephalic and atraumatic.     Mouth/Throat:     Mouth: Mucous membranes are moist.     Pharynx: Oropharynx is clear. No posterior oropharyngeal erythema.  Skin:    General: Skin is warm and dry.     Capillary Refill: Capillary refill takes less than 2 seconds.     Findings: No rash.  Neurological:     General: No focal deficit present.     Mental Status: He is alert and oriented to person, place, and time.      UC Treatments / Results  Labs (all labs ordered are listed, but only abnormal results are displayed) Labs Reviewed - No data to display  EKG   Radiology No results found.  Procedures Procedures (including critical care time)  Medications Ordered in UC Medications - No data to display  Initial Impression / Assessment and  Plan / UC Course  I have reviewed the triage vital signs and the nursing notes.  Pertinent labs & imaging results that were available during my care of the patient were reviewed by me and considered in my medical decision making (see chart for details).   Patient is a nontoxic-appearing 53 year old male presenting for evaluation of worsening dental pain and facial swelling on the left-hand side.  He is scheduled to have his left lower third molar extracted tomorrow and reports that he has been having increasing pain in that tooth over the last 3 days.  This morning he developed pain with swallowing, and inability to open his jaw, and swelling to the left side of his jaw and neck.  He is able to speak in full sentence without dyspnea or tachypnea and he is able to swallow his saliva.  He cannot open his mouth more than approximately 2 cm.  I do not appreciate any erythema surrounding the left lower third molar.  Even with a tongue blade I am unable to visualize his oropharynx to determine if he has any tonsillar hypertrophy or exudate.  There is a tender swollen area near the angle of the jaw which may represent an abscess formation.  Given that he is experiencing trismus I feel he needs to be evaluated in the emergency department as he will need a CT scan to evaluate the soft tissue of his neck to determine if he possibly has a involved dental or peritonsillar abscess.  I discussed my concerns with the patient and he has elected to go to [facility].  He left ambulatory and in stable condition.  I called and gave report to Slater, the triage nurse at [facility].   Final Clinical Impressions(s) / UC Diagnoses   Final diagnoses:  Trismus  Pain, dental  Submandibular swelling     Discharge Instructions      As we discussed, you need imaging of your neck to determine if you have an abscess from either your tooth or from one of your tonsils.  Given you cannot fully open your mouth this is a potentially  life-threatening emergency.  Please go to [facility] to be evaluated.  ED Prescriptions   None    PDMP not reviewed this encounter.   Bernardino Ditch, NP 10/17/24 [phone]

## 2024-10-17 NOTE — ED Notes (Signed)
 Patient is being discharged from the Urgent Care and sent to the Emergency Department via POV . Per Venetia Motto NP, patient is in need of higher level of care due to dental pain. Patient is aware and verbalizes understanding of plan of care.  Vitals:   10/17/24 0921  BP: 126/86  Pulse: 83  Resp: 14  Temp: 99.1 F (37.3 C)  SpO2: 97%

## 2024-10-17 NOTE — Consult Note (Signed)
 Pharmacy Antibiotic Note  Sidhant Helderman is a 53 y.o. 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 Pharmacy has been consulted for vancomycin dosing.  Plan:  Vancomycin 2 g IV loading dose followed by vancomycin 1.5 g IV q12h --Calculated AUC: 424, Cmin: 10.8 --Scr 0.83, IBW, Vd 0.72 --Daily Scr per protocol --Levels at steady state or as clinically indicated  Weight: 101.8 kg (224 lb 6.4 oz)  Temp (24hrs), Avg:98.4 F (36.9 C), Min:98 F (36.7 C), Max:99.1 F (37.3 C)  Recent Labs  Lab 10/17/24 1129  WBC 9.5  CREATININE 0.83    CrCl cannot be calculated (Unknown ideal weight.).    No Known Allergies  Antimicrobials this admission: Unasyn 11/5 x 1 Vancomycin 11/5 >>   Dose adjustments this admission: N/A  Microbiology results: 11/5 BCx: pending 11/5 MRSA PCR: pending  Thank you for allowing pharmacy to be a part of this patient's care.  Marolyn KATHEE Mare 10/17/2024 3:27 PM

## 2024-10-17 NOTE — H&P (Signed)
 Mario Mcgee    [name] FMW:[id number] DOB: 1971-07-09 DOA: 10/17/2024  DOS: the patient was seen and examined on 10/17/2024  PCP: Patient, No Pcp Per   Patient coming from: Home  I have personally briefly reviewed patient's old medical records in [facility] and CareEverywhere  HPI:   Mario Mcgee is a 53 y.o. year old male without significant medical Mario presenting to the ED with worsening dental pain of 3 days duration and trismus.   Pt states he developed a toothache about 5 to 6 days ago that has been progressively getting worse.  He scheduled an appointment for extraction that is on 11/06 but given the severity of the pain over the last day he decided to go to the urgent care as we can get some pain medicine.  Upon evaluation in the urgent care they recommended to go to the ED.  Patient states he does not have any fevers but did have some chills yesterday.  No other symptoms of infection.  Patient does not have a PCP and usually goes to the urgent care on an as needed basis.  He is not taking any prescription medications currently but chart review shows he is taking Adderall.   On arrival to the ED patient was noted to be HDS stable.  Lab work and imaging obtained.  CMP and CBC are unremarkable.  CT neck showed inflammatory stranding in the left parapharyngeal space and multi spatial inflammation centered at the left submandibular and sublingual space with small developing abscess.  There is secondary inflammation of the left submandibular gland and left parapharyngeal space along with reactive lymph nodes.  EDP discussed with Dr. Blair from ENT who stated no surgical intervention needed at this time and patient should be admitted for IV antibiotics and IV steroids.   TRH contacted for admission.  Review of Systems: As mentioned in the Mario of present illness. All other systems reviewed and are negative.   Past Medical Mario:  Diagnosis Date   ADHD      Past Surgical Mario:  Procedure Laterality Date   NO PAST SURGERIES       No Known Allergies  Family Mario  Problem Relation Age of Onset   Healthy Mother    Liver disease Father     Prior to Admission medications   Medication Sig Start Date End Date Taking? Authorizing Provider  amphetamine-dextroamphetamine (ADDERALL) 5 MG tablet Take 1 tablet by mouth daily as needed. 09/19/24  Yes [provider]  amphetamine-dextroamphetamine (ADDERALL) 10 MG tablet Take 10 mg by mouth every morning. 09/19/24   [provider]  HYDROcodone -acetaminophen  (NORCO/VICODIN) 5-325 MG tablet Take 2 tablets by mouth every 6 (six) hours as needed for moderate pain or severe pain. Patient not taking: Reported on 10/17/2024 09/11/20   Gordan Huxley, MD  ketorolac  (TORADOL ) 10 MG tablet Take 1 tablet (10 mg total) by mouth every 6 (six) hours as needed for moderate pain or severe pain. Patient not taking: Reported on 10/17/2024 05/19/20   Cook, Jayce G, DO    Social Mario:  reports that he has quit smoking. He has never used smokeless tobacco. He reports current alcohol use. He reports that he does not use drugs. 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 EtOH-drinks liquor, beer, wine on a weekly basis.  Has not struggled with alcohol and never had any withdrawal symptoms. Illicit drug  use- denies use.  IADLs/ADLs- can perform independently at baseline    Mcgee Exam: Vitals:   10/17/24 1200 10/17/24 1230 10/17/24 1330 10/17/24 1345  BP: (!) 129/92 106/86 (!) 146/98 (!) 128/99  Pulse: 77 77 81 83  Resp:    18  Temp:    98 F (36.7 C)  TempSrc:    Oral  SpO2: 99% 99% 98% 96%  Weight:         Gen: NAD HENT: Left submandibular swelling, with TTP of the area.  Trismus noted.  No other abnormality noted in the mouth. CV: Regular rate and rhythm, good pulses Resp: CTAB, no stridor,  protecting airway Abd: No TTP, no bowel sounds MSK: No symmetry, good bulk and tone Skin: No lesions noted on skin Neuro: Alert and oriented x 4 Psych: Pleasant mood   Labs on Admission: I have personally reviewed following labs and imaging studies  CBC: Recent Labs  Lab 10/17/24 1129  WBC 9.5  NEUTROABS 7.5  HGB 15.5  HCT 45.1  MCV 95.1  PLT 220   Basic Metabolic Panel: Recent Labs  Lab 10/17/24 1129  NA 137  K 3.9  CL 99  CO2 25  GLUCOSE 120*  BUN 9  CREATININE 0.83  CALCIUM 9.0   GFR: CrCl cannot be calculated (Unknown ideal weight.). Liver Function Tests: Recent Labs  Lab 10/17/24 1129  AST 32  ALT 37  ALKPHOS 57  BILITOT 2.2*  PROT 8.0  ALBUMIN 3.8   No results for input(s): LIPASE, AMYLASE in the last 168 hours. No results for input(s): AMMONIA in the last 168 hours. Coagulation Profile: No results for input(s): INR, PROTIME in the last 168 hours. Cardiac Enzymes: No results for input(s): CKTOTAL, CKMB, CKMBINDEX, TROPONINI, TROPONINIHS in the last 168 hours. BNP (last 3 results) No results for input(s): BNP in the last 8760 hours. HbA1C: No results for input(s): HGBA1C in the last 72 hours. CBG: No results for input(s): GLUCAP in the last 168 hours. Lipid Profile: No results for input(s): CHOL, HDL, LDLCALC, TRIG, CHOLHDL, LDLDIRECT in the last 72 hours. Thyroid Function Tests: No results for input(s): TSH, T4TOTAL, FREET4, T3FREE, THYROIDAB in the last 72 hours. Anemia Panel: No results for input(s): VITAMINB12, FOLATE, FERRITIN, TIBC, IRON, RETICCTPCT in the last 72 hours. Urine analysis: No results found for: COLORURINE, APPEARANCEUR, LABSPEC, PHURINE, GLUCOSEU, HGBUR, BILIRUBINUR, KETONESUR, PROTEINUR, UROBILINOGEN, NITRITE, LEUKOCYTESUR  Radiological Exams on Admission: I have personally reviewed images CT Soft Tissue Neck W Contrast Result Date:  10/17/2024 EXAM: CT NECK WITH CONTRAST 10/17/2024 01:03:16 PM TECHNIQUE: CT of the neck was performed with the administration of 75 mL of iohexol (OMNIPAQUE) 300 MG/ML solution. Multiplanar reformatted images are provided for review. Automated exposure control, iterative reconstruction, and/or weight based adjustment of the mA/kV was utilized to reduce the radiation dose to as low as reasonably achievable. COMPARISON: None available. CLINICAL Mario: 53 year old male. Facial and left submandibular swelling, eval abscess. FINDINGS: AERODIGESTIVE TRACT: No discrete mass. There is mild asymmetric edema along the left lateral wall of the pharynx. The retropharyngeal and right parapharyngeal spaces appear more normal. Nasopharynx is negative. Negative visible subglottic airway. SALIVARY GLANDS: The left submandibular gland is asymmetrically enlarged and indistinct. There is no left submandibular duct dilatation identified. The left sublingual space is abnormal. The contralateral right sublingual and submandibular spaces are negative. The parotid glands are unremarkable. THYROID: Unremarkable. LYMPH NODES: Small but reactive appearing left level 1 and level 2 lymph nodes, individually up to 8 mm short  axis. SOFT TISSUES: There is confluent inflammatory stranding in the left parapharyngeal space. There is an abnormal rim enhancing low density area tracking in the left mylohyoid muscle, best seen on coronal image 53 and also series 2 images 62 through 67. This lesion measures about 5 x 9 x 16 mm (AP x transverse x CC). The estimated volume of an ellipsoid from these measurements is 0 mL. There is asymmetric subperiosteal hypodensity along the posterior body of the left mandible, and this corresponds to the level of an impacted left mandible wisdom tooth. No mandible cortical dehiscence is identified. However, the hypodensity appears to track from that area in a relatively linear fashion into the abnormal left mylohyoid  muscle. The central sublingual space remains normal. The anterior left sublingual space is inflamed. There is mild inflammation in the left lower masticator space. The upper left masticator space, left parotid space, and right masticator space are negative. Carious contralateral right mandible anterior molar. Major vascular structures in the bilateral neck and skull base are enhancing and patent. BRAIN, ORBITS, SINUSES AND MASTOIDS: No acute abnormality. Visible paranasal sinuses, middle ears and mastoids are well aerated. LUNGS AND MEDIASTINUM: No acute abnormality. Negative visible upper lungs and superior mediastinum. BONES: Ordinary cervical spine degeneration. IMPRESSION: 1. Multispatial inflammation centered at the left submandibular and sublingual spaces; constellation strongly suggests this is complication from a posterior mandible dental infection - with subperiosteal edema/phlegmon along the posterior body of the left mandible, and a small or developing intramuscular abscess (estimated 1 mL) tracking through the left mylohyoid muscle. But no dental periapical dehiscence is associated. 2. Secondary inflammation of the left submandibular gland, also in the left lower masticator and the left parapharyngeal spaces. Small reactive left level 1 and level 2 lymph nodes. Electronically signed by: Helayne Hurst MD 10/17/2024 01:52 PM EST RP Workstation: [id number]    EKG: My personal interpretation of EKG shows: Pending    Assessment/Plan Principal Problem:   Ludwig angina   Patient with dental infection that has complicated by development of Ludwig angina.  ENT consulted and recommend medical treatment with IV antibiotics and steroids.  Will start him on vancomycin continue Unasyn.  She will receive Decadron 10 mg will discuss with ENT regarding further steroids.  Blood cultures ordered.  CBC without leukocytosis.  Will trend.  Given inflammation noted on CT in the area of involvement, there is concern  for.  Currently patient is protecting his airway, so we will admit him to stepdown unit for close monitoring.  Will treat pain with multimodal approach.  Elevated blood pressure: Likely secondary to pain.  Will continue to monitor.  Hyperglycemia: Getting A1c.   VTE prophylaxis:  SQ Heparin  Diet: NPO Code Status:  Full Code Telemetry:  Admission status: Inpatient, Progressive Patient is from: Home Anticipated d/c is to: Home Anticipated d/c is in: 2-3 days   Family Communication: Updated at bedside  Consults called: None   Severity of Illness: The appropriate patient status for this patient is INPATIENT. Inpatient status is judged to be reasonable and necessary in order to provide the required intensity of service to ensure the patient's safety. The patient's presenting symptoms, Mcgee exam findings, and initial radiographic and laboratory data in the context of their chronic comorbidities is felt to place them at high risk for further clinical deterioration. Furthermore, it is not anticipated that the patient will be medically stable for discharge from the hospital within 2 midnights of admission.   * I certify that at the  point of admission it is my clinical judgment that the patient will require inpatient hospital care spanning beyond 2 midnights from the point of admission due to high intensity of service, high risk for further deterioration and high frequency of surveillance required.DEWAINE Morene Bathe, MD [facility] DEL. [facility]

## 2024-10-18 DIAGNOSIS — K122 Cellulitis and abscess of mouth: Secondary | ICD-10-CM | POA: Diagnosis not present

## 2024-10-18 LAB — CBC
HCT: 47.7 % (ref 39.0–52.0)
Hemoglobin: 15.6 g/dL (ref 13.0–17.0)
MCH: 32.5 pg (ref 26.0–34.0)
MCHC: 32.7 g/dL (ref 30.0–36.0)
MCV: 99.4 fL (ref 80.0–100.0)
Platelets: 242 K/uL (ref 150–400)
RBC: 4.8 MIL/uL (ref 4.22–5.81)
RDW: 11.9 % (ref 11.5–15.5)
WBC: 10.3 K/uL (ref 4.0–10.5)
nRBC: 0 % (ref 0.0–0.2)

## 2024-10-18 LAB — GLUCOSE, CAPILLARY: Glucose-Capillary: 135 mg/dL — ABNORMAL HIGH (ref 70–99)

## 2024-10-18 LAB — BASIC METABOLIC PANEL WITH GFR
Anion gap: 13 (ref 5–15)
BUN: 16 mg/dL (ref 6–20)
CO2: 20 mmol/L — ABNORMAL LOW (ref 22–32)
Calcium: 8.8 mg/dL — ABNORMAL LOW (ref 8.9–10.3)
Chloride: 101 mmol/L (ref 98–111)
Creatinine, Ser: 0.86 mg/dL (ref 0.61–1.24)
GFR, Estimated: >60 mL/min (ref >60)
Glucose, Bld: 147 mg/dL — ABNORMAL HIGH (ref 70–99)
Potassium: 4.7 mmol/L (ref 3.5–5.1)
Sodium: 134 mmol/L — ABNORMAL LOW (ref 135–145)

## 2024-10-18 NOTE — Progress Notes (Signed)
 Progress Note   Patient: Mario Mcgee FMW:[id number] DOB: 1971/09/26 DOA: 10/17/2024     1 DOS: the patient was seen and examined on 10/18/2024   Brief hospital course:  From HPI Mario Mcgee is a 53 y.o. year old male without significant medical history presenting to the ED with worsening dental pain of 3 days duration and trismus.     Pt states he developed a toothache about 5 to 6 days ago that has been progressively getting worse.  He scheduled an appointment for extraction that is on 11/06 but given the severity of the pain over the last day he decided to go to the urgent care as we can get some pain medicine.  Upon evaluation in the urgent care they recommended to go to the ED.  Patient states he does not have any fevers but did have some chills yesterday.  No other symptoms of infection.   Patient does not have a PCP and usually goes to the urgent care on an as needed basis.  He is not taking any prescription medications currently but chart review shows he is taking Adderall.     On arrival to the ED patient was noted to be HDS stable.  Lab work and imaging obtained.  CMP and CBC are unremarkable.  CT neck showed inflammatory stranding in the left parapharyngeal space and multi spatial inflammation centered at the left submandibular and sublingual space with small developing abscess.  There is secondary inflammation of the left submandibular gland and left parapharyngeal space along with reactive lymph nodes.  EDP discussed with Dr. Blair from ENT who stated no surgical intervention needed at this time and patient should be admitted for IV antibiotics and IV steroids.     TRH contacted for admission.    Assessment and Plan:   Ludwig angina   Patient with dental infection that has complicated by development of Ludwig angina.   Swelling improving Patient has been seen by ENT surgeon with plans to continue Unasyn and to switch to clindamycin or Augmentin at discharge Patient received  Decadron Monitor CBC closely Continue as needed pain medication  Vancomycin discontinued today   Elevated blood pressure:  Continue to monitor closely   Hyperglycemia: A1c 5.2 Glucose level improved   VTE prophylaxis:  SQ Heparin   Subjective:  Patient seen and examined at bedside this morning Denies nausea vomiting abdominal pain chest pain Left submandibular region swelling but improving  Physical Exam: HENT: Left submandibular swelling and tenderness noted  CV: Regular rate and rhythm, good pulses Resp: CTAB, no stridor, protecting airway Abd: No TTP, no bowel sounds MSK: No symmetry, good bulk and tone Skin: No lesions noted on skin Neuro: Alert and oriented x 4 Psych: Pleasant mood    Vitals:   10/18/24 0800 10/18/24 0900 10/18/24 1115 10/18/24 1414  BP: 121/88  114/80 115/80  Pulse: 66 82 66 64  Resp: 12 13 12 16   Temp:   (!) 97.5 F (36.4 C)   TempSrc:   Oral   SpO2: 95% 99% 98% 98%  Weight:      Height:        Data Reviewed: I have reviewed patient's CT scan of soft tissue of the neck no abscess noted    Latest Ref Rng & Units 10/18/2024    5:40 AM 10/17/2024   11:29 AM 09/10/2020   11:33 PM  CBC  WBC 4.0 - 10.5 K/uL 10.3  9.5  8.5   Hemoglobin 13.0 - 17.0 g/dL 15.6  15.5  14.4   Hematocrit 39.0 - 52.0 % 47.7  45.1  41.8   Platelets 150 - 400 K/uL 242  220  266        Latest Ref Rng & Units 10/18/2024    5:40 AM 10/17/2024   11:29 AM 09/10/2020   11:33 PM  BMP  Glucose 70 - 99 mg/dL 852  879  882   BUN 6 - 20 mg/dL 16  9  15    Creatinine 0.61 - 1.24 mg/dL 9.13  9.16  9.03   Sodium 135 - 145 mmol/L 134  137  139   Potassium 3.5 - 5.1 mmol/L 4.7  3.9  4.1   Chloride 98 - 111 mmol/L 101  99  106   CO2 22 - 32 mmol/L 20  25  21    Calcium 8.9 - 10.3 mg/dL 8.8  9.0  8.4      Author: Drue ONEIDA Potter, MD 10/18/2024 6:01 PM  For on call review [email or web address].

## 2024-10-18 NOTE — Progress Notes (Signed)
 Patient ID: Mario Mcgee, male   DOB: 1971/11/14, 53 y.o.   MRN: [id number] Mario Mcgee, [name] [id number] 03-26-1971 Mario GORMAN Dolly, MD   SUBJECTIVE: This 53 y.o. year old male is admitted for a dental infection with swelling in the left neck.  He is feeling much better today with significant reduction in swelling and resolution of trismus.  Earlier today he noted a foul taste in his mouth and has developed some drainage from the subperiosteal abscess. Medications:  Current Facility-Administered Medications  Medication Dose Route Frequency Provider Last Rate Last Admin   acetaminophen  (TYLENOL ) tablet 650 mg  650 mg Oral Q6H PRN Fernand Prost, MD       Or   acetaminophen  (TYLENOL ) suppository 650 mg  650 mg Rectal Q6H PRN Khan, Ghalib, MD       Ampicillin-Sulbactam (UNASYN) 3 g in sodium chloride 0.9 % 100 mL IVPB  3 g Intravenous Q6H Khan, Ghalib, MD 200 mL/hr at 10/18/24 1111 3 g at 10/18/24 1111   Chlorhexidine Gluconate Cloth 2 % PADS 6 each  6 each Topical QHS Khan, Ghalib, MD       dexamethasone (DECADRON) injection 4 mg  4 mg Intravenous Q12H Khan, Ghalib, MD   4 mg at 10/18/24 0946   heparin injection 5,000 Units  5,000 Units Subcutaneous Q8H Fernand Prost, MD       HYDROmorphone (DILAUDID) injection 0.5-1 mg  0.5-1 mg Intravenous Q2H PRN Khan, Ghalib, MD   1 mg at 10/18/24 1234   ketorolac  (TORADOL ) 15 MG/ML injection 15 mg  15 mg Intravenous Q6H Khan, Ghalib, MD   15 mg at 10/18/24 1110   oxyCODONE (Oxy IR/ROXICODONE) immediate release tablet 5 mg  5 mg Oral Q4H PRN Khan, Ghalib, MD   5 mg at 10/18/24 9361   senna-docusate (Senokot-S) tablet 1 tablet  1 tablet Oral QHS PRN Khan, Ghalib, MD       sodium chloride flush (NS) 0.9 % injection 3 mL  3 mL Intravenous Q12H Khan, Ghalib, MD   3 mL at 10/18/24 0955   vancomycin (VANCOREADY) IVPB 1500 mg/300 mL  1,500 mg Intravenous Q12H Clair Marolyn NOVAK, RPH   Stopped at 10/18/24 1013  .  Medications Prior to Admission  Medication Sig Dispense Refill    amphetamine-dextroamphetamine (ADDERALL) 10 MG tablet Take 10 mg by mouth every morning.     amphetamine-dextroamphetamine (ADDERALL) 5 MG tablet Take 1 tablet by mouth daily as needed (adhd symptons). Take 1 tablet every day at noon and as needed for ADHD symtpoms     HYDROcodone -acetaminophen  (NORCO/VICODIN) 5-325 MG tablet Take 2 tablets by mouth every 6 (six) hours as needed for moderate pain or severe pain. (Patient not taking: Reported on 10/17/2024) 16 tablet 0   ketorolac  (TORADOL ) 10 MG tablet Take 1 tablet (10 mg total) by mouth every 6 (six) hours as needed for moderate pain or severe pain. (Patient not taking: Reported on 10/17/2024) 20 tablet 0    OBJECTIVE:  PHYSICAL EXAM  Vitals: Blood pressure 114/80, pulse 66, temperature (!) 97.5 F (36.4 C), temperature source Oral, resp. rate 12, height 5' 11 (1.803 m), weight 102.6 kg, SpO2 98%.. General: Well-developed, Well-nourished in no acute distress Mood: Mood and affect well adjusted, pleasant and cooperative. Orientation: Grossly alert and oriented. Vocal Quality: No hoarseness. Communicates verbally. head and Face: NCAT. No facial asymmetry. No visible skin lesions. No significant facial scars. No tenderness with sinus percussion. Facial strength normal and symmetric. Oral Cavity/ Oropharynx: Lips are normal with no  lesions. There is a new development of active purulent drainage from the subperiosteal abscess into the mouth on the medial aspect of the mandible just below the posterior molar that had been the source of infection.  Oropharynx including tongue, buccal mucosa, floor of mouth, hard and soft palate, uvula and posterior pharynx free of exudates, erythema or lesions with normal symmetry and hydration.  No swelling in the floor of the mouth. Neck: Supple with improvement in left neck swelling.  There is still some edema but it is nontender anLymphatic: Cervical lymph nodes are without palpable lymphadenopathy or  tenderness. Respiratory: Normal respiratory effort without labored breathing.  MEDICAL DECISION MAKING: Data Review:  Results for orders placed or performed during the hospital encounter of 10/17/24 (from the past 48 hours)  CBC with Differential     Status: None   Collection Time: 10/17/24 11:29 AM  Result Value Ref Range   WBC 9.5 4.0 - 10.5 K/uL   RBC 4.74 4.22 - 5.81 MIL/uL   Hemoglobin 15.5 13.0 - 17.0 g/dL   HCT 54.8 60.9 - 47.9 %   MCV 95.1 80.0 - 100.0 fL   MCH 32.7 26.0 - 34.0 pg   MCHC 34.4 30.0 - 36.0 g/dL   RDW 88.0 88.4 - 84.4 %   Platelets 220 150 - 400 K/uL   nRBC 0.0 0.0 - 0.2 %   Neutrophils Relative % 79 %   Neutro Abs 7.5 1.7 - 7.7 K/uL   Lymphocytes Relative 10 %   Lymphs Abs 0.9 0.7 - 4.0 K/uL   Monocytes Relative 10 %   Monocytes Absolute 0.9 0.1 - 1.0 K/uL   Eosinophils Relative 0 %   Eosinophils Absolute 0.0 0.0 - 0.5 K/uL   Basophils Relative 0 %   Basophils Absolute 0.0 0.0 - 0.1 K/uL   Immature Granulocytes 1 %   Abs Immature Granulocytes 0.05 0.00 - 0.07 K/uL    Comment: Performed at [facility], [address] [place], [place] [address]  Comprehensive metabolic panel     Status: Abnormal   Collection Time: 10/17/24 11:29 AM  Result Value Ref Range   Sodium 137 135 - 145 mmol/L   Potassium 3.9 3.5 - 5.1 mmol/L   Chloride 99 98 - 111 mmol/L   CO2 25 22 - 32 mmol/L   Glucose, Bld 120 (H) 70 - 99 mg/dL    Comment: Glucose reference range applies only to samples taken after fasting for at least 8 hours.   BUN 9 6 - 20 mg/dL   Creatinine, Ser 9.16 0.61 - 1.24 mg/dL   Calcium 9.0 8.9 - 89.6 mg/dL   Total Protein 8.0 6.5 - 8.1 g/dL   Albumin 3.8 3.5 - 5.0 g/dL   AST 32 15 - 41 U/L   ALT 37 0 - 44 U/L   Alkaline Phosphatase 57 38 - 126 U/L   Total Bilirubin 2.2 (H) 0.0 - 1.2 mg/dL   GFR, Estimated >39 >39 mL/min    Comment: (NOTE) Calculated using the CKD-EPI Creatinine Equation (2021)    Anion gap 13 5 - 15    Comment: Performed  at [facility], [address] [place], [place] [address]  Hemoglobin A1c     Status: None   Collection Time: 10/17/24 11:29 AM  Result Value Ref Range   Hgb A1c MFr Bld 5.2 4.8 - 5.6 %    Comment: (NOTE) Diagnosis of Diabetes The following HbA1c ranges recommended by the American Diabetes Association (ADA) may  be used as an aid in the diagnosis of diabetes mellitus.  Hemoglobin             Suggested A1C NGSP%              Diagnosis  <5.7                   Non Diabetic  5.7-6.4                Pre-Diabetic  >6.4                   Diabetic  <7.0                   Glycemic control for                       adults with diabetes.     Mean Plasma Glucose 102.54 mg/dL    Comment: Performed at [facility], [address], [place], [place] [address]  Lipid panel     Status: None   Collection Time: 10/17/24 11:29 AM  Result Value Ref Range   Cholesterol 154 0 - 200 mg/dL   Triglycerides 87 <849 mg/dL   HDL 66 >59 mg/dL   Total CHOL/HDL Ratio 2.3 RATIO   VLDL 17 0 - 40 mg/dL   LDL Cholesterol 71 0 - 99 mg/dL    Comment:        Total Cholesterol/HDL:CHD Risk Coronary Heart Disease Risk Table                     Men   Women  1/2 Average Risk   3.4   3.3  Average Risk       5.0   4.4  2 X Average Risk   9.6   7.1  3 X Average Risk  23.4   11.0        Use the calculated Patient Ratio above and the CHD Risk Table to determine the patient's CHD Risk.        ATP III CLASSIFICATION (LDL):  <100     mg/dL   Optimal  899-870  mg/dL   Near or Above                    Optimal  130-159  mg/dL   Borderline  839-810  mg/dL   High  >809     mg/dL   Very High Performed at [facility], [address] [place], [place] [address]   Magnesium     Status: None   Collection Time: 10/17/24 11:29 AM  Result Value Ref Range   Magnesium 2.3 1.7 - 2.4 mg/dL    Comment: Performed at [facility], [address] [place], [place] [address]  Glucose,  capillary     Status: Abnormal   Collection Time: 10/17/24  4:44 PM  Result Value Ref Range   Glucose-Capillary 132 (H) 70 - 99 mg/dL    Comment: Glucose reference range applies only to samples taken after fasting for at least 8 hours.  MRSA Next Gen by PCR, Nasal     Status: None   Collection Time: 10/17/24  4:48 PM   Specimen: Nasal Mucosa; Nasal Swab  Result Value Ref Range   MRSA by PCR Next Gen NOT DETECTED NOT DETECTED    Comment: (NOTE) The GeneXpert MRSA Assay (FDA approved for NASAL specimens only), is one component of a  comprehensive MRSA colonization surveillance program. It is not intended to diagnose MRSA infection nor to guide or monitor treatment for MRSA infections. Test performance is not FDA approved in patients less than 57 years old. Performed at [facility], [address] [place], [place] [address]   HIV Antibody (routine testing w rflx)     Status: None   Collection Time: 10/17/24  5:18 PM  Result Value Ref Range   HIV Screen 4th Generation wRfx Non Reactive Non Reactive    Comment: Performed at [facility], [address], [place], [place] [address]  Culture, blood (Routine X 2) w Reflex to ID Panel     Status: None (Preliminary result)   Collection Time: 10/17/24  5:21 PM   Specimen: BLOOD  Result Value Ref Range   Specimen Description BLOOD BLOOD RIGHT HAND    Special Requests      BOTTLES DRAWN AEROBIC AND ANAEROBIC Blood Culture adequate volume   Culture      NO GROWTH < 12 HOURS Performed at [facility], [address] [place], [place] [address]    Report Status PENDING   Culture, blood (Routine X 2) w Reflex to ID Panel     Status: None (Preliminary result)   Collection Time: 10/17/24  5:22 PM   Specimen: BLOOD  Result Value Ref Range   Specimen Description BLOOD BLOOD LEFT ARM    Special Requests      BOTTLES DRAWN AEROBIC AND ANAEROBIC Blood Culture adequate volume   Culture      NO GROWTH < 12 HOURS Performed  at [facility], [address] [place], [place] [address]    Report Status PENDING   Basic metabolic panel     Status: Abnormal   Collection Time: 10/18/24  5:40 AM  Result Value Ref Range   Sodium 134 (L) 135 - 145 mmol/L   Potassium 4.7 3.5 - 5.1 mmol/L   Chloride 101 98 - 111 mmol/L   CO2 20 (L) 22 - 32 mmol/L   Glucose, Bld 147 (H) 70 - 99 mg/dL    Comment: Glucose reference range applies only to samples taken after fasting for at least 8 hours.   BUN 16 6 - 20 mg/dL   Creatinine, Ser 9.13 0.61 - 1.24 mg/dL   Calcium 8.8 (L) 8.9 - 10.3 mg/dL   GFR, Estimated >39 >39 mL/min    Comment: (NOTE) Calculated using the CKD-EPI Creatinine Equation (2021)    Anion gap 13 5 - 15    Comment: Performed at [facility], [address] [place], [place] [address]  CBC     Status: None   Collection Time: 10/18/24  5:40 AM  Result Value Ref Range   WBC 10.3 4.0 - 10.5 K/uL   RBC 4.80 4.22 - 5.81 MIL/uL   Hemoglobin 15.6 13.0 - 17.0 g/dL   HCT 52.2 60.9 - 47.9 %   MCV 99.4 80.0 - 100.0 fL   MCH 32.5 26.0 - 34.0 pg   MCHC 32.7 30.0 - 36.0 g/dL   RDW 88.0 88.4 - 84.4 %   Platelets 242 150 - 400 K/uL   nRBC 0.0 0.0 - 0.2 %    Comment: Performed at [facility], [address] [place], [place] [address]  Glucose, capillary     Status: Abnormal   Collection Time: 10/18/24  7:53 AM  Result Value Ref Range   Glucose-Capillary 135 (H) 70 - 99 mg/dL  Comment: Glucose reference range applies only to samples taken after fasting for at least 8 hours.  . CT Soft Tissue Neck W Contrast Result Date: 10/17/2024 EXAM: CT NECK WITH CONTRAST 10/17/2024 01:03:16 PM TECHNIQUE: CT of the neck was performed with the administration of 75 mL of iohexol (OMNIPAQUE) 300 MG/ML solution. Multiplanar reformatted images are provided for review. Automated exposure control, iterative reconstruction, and/or weight based adjustment of the mA/kV was utilized to reduce the radiation dose to  as low as reasonably achievable. COMPARISON: None available. CLINICAL HISTORY: 53 year old male. Facial and left submandibular swelling, eval abscess. FINDINGS: AERODIGESTIVE TRACT: No discrete mass. There is mild asymmetric edema along the left lateral wall of the pharynx. The retropharyngeal and right parapharyngeal spaces appear more normal. Nasopharynx is negative. Negative visible subglottic airway. SALIVARY GLANDS: The left submandibular gland is asymmetrically enlarged and indistinct. There is no left submandibular duct dilatation identified. The left sublingual space is abnormal. The contralateral right sublingual and submandibular spaces are negative. The parotid glands are unremarkable. THYROID: Unremarkable. LYMPH NODES: Small but reactive appearing left level 1 and level 2 lymph nodes, individually up to 8 mm short axis. SOFT TISSUES: There is confluent inflammatory stranding in the left parapharyngeal space. There is an abnormal rim enhancing low density area tracking in the left mylohyoid muscle, best seen on coronal image 53 and also series 2 images 62 through 67. This lesion measures about 5 x 9 x 16 mm (AP x transverse x CC). The estimated volume of an ellipsoid from these measurements is 0 mL. There is asymmetric subperiosteal hypodensity along the posterior body of the left mandible, and this corresponds to the level of an impacted left mandible wisdom tooth. No mandible cortical dehiscence is identified. However, the hypodensity appears to track from that area in a relatively linear fashion into the abnormal left mylohyoid muscle. The central sublingual space remains normal. The anterior left sublingual space is inflamed. There is mild inflammation in the left lower masticator space. The upper left masticator space, left parotid space, and right masticator space are negative. Carious contralateral right mandible anterior molar. Major vascular structures in the bilateral neck and skull base are  enhancing and patent. BRAIN, ORBITS, SINUSES AND MASTOIDS: No acute abnormality. Visible paranasal sinuses, middle ears and mastoids are well aerated. LUNGS AND MEDIASTINUM: No acute abnormality. Negative visible upper lungs and superior mediastinum. BONES: Ordinary cervical spine degeneration. IMPRESSION: 1. Multispatial inflammation centered at the left submandibular and sublingual spaces; constellation strongly suggests this is complication from a posterior mandible dental infection - with subperiosteal edema/phlegmon along the posterior body of the left mandible, and a small or developing intramuscular abscess (estimated 1 mL) tracking through the left mylohyoid muscle. But no dental periapical dehiscence is associated. 2. Secondary inflammation of the left submandibular gland, also in the left lower masticator and the left parapharyngeal spaces. Small reactive left level 1 and level 2 lymph nodes. Electronically signed by: Helayne Hurst MD 10/17/2024 01:52 PM EST RP Workstation: [id number]  .   ASSESSMENT: Significant improvement and neck swelling from dental infection which has now begun to drain spontaneously in the mouth.  PLAN: Patient should be fine for later discharge on oral antibiotics and follow-up with his dentist to have the offending tooth extracted.  No specific ENT follow-up is necessary.  Clindamycin or Augmentin would be reasonable antibiotic choices for outpatient management.   Mario GORMAN Dolly, MD 10/18/2024 1:25 PM

## 2024-10-18 NOTE — TOC Initial Note (Signed)
 Transition of Care [facility]) - Initial/Assessment Note    Patient Details  Name: Mario Mcgee MRN: [id number] Date of Birth: 10/25/1971  Transition of [facility]) CM/SW Contact:    Corrie JINNY Ruts, LCSW Phone Number: 10/18/2024, 3:20 PM  Clinical Narrative:                 Chart reviewed. The patient was admitted for Ludwig angina. I was able to speak with the patient at bedside today. I introduced myself, my role, and reason for consult. The patient reports that he has been doing well today. The patient confirmed that he does not have a PCP. The patient was interested in PCP resources. The patient reports that he lives in the home with his wife and his daughter.   The patient reports that he is able to complete daily living task independently and and could drive himself to medical appointments. The patient reports that his wife will assist him during D/C. The patient reports that [facility] [place]. The patient reports that he has never had HH or been admitted into a SNF in the past. The patient reports that he has a cane in the home.   The patient did not have any questions or concerns during the assessment. I have added PCP resources to patient AVS.   There are no other TOC needs at this time.     Barriers to Discharge: Continued Medical Work up   Patient Goals and CMS Choice            Expected Discharge Plan and Services                                              Prior Living Arrangements/Services   Lives with:: Self, Minor Children Patient language and need for interpreter reviewed:: Yes        Need for Family Participation in Patient Care: Yes (Comment)     Criminal Activity/Legal Involvement Pertinent to Current Situation/Hospitalization: No - Comment as needed  Activities of Daily Living      Permission Sought/Granted                  Emotional Assessment Appearance:: Appears stated age Attitude/Demeanor/Rapport: Engaged,  Gracious Affect (typically observed): Calm, Pleasant Orientation: : Oriented to Self, Oriented to Place, Oriented to  Time, Oriented to Situation Alcohol / Substance Use: Not Applicable Psych Involvement: No (comment)  Admission diagnosis:  Submandibular space infection [K12.2] 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 [place] [address] Phone: [phone] Fax: [phone]     Social Drivers of Health (SDOH) Social History: SDOH Screenings   Food Insecurity: No Food Insecurity (10/17/2024)  Housing: Unknown (10/17/2024)  Transportation Needs: No Transportation Needs (10/17/2024)  Utilities: Not At Risk (10/17/2024)  Tobacco Use: Medium Risk (10/17/2024)   SDOH Interventions: Food Insecurity Interventions: Patient Declined Housing Interventions: Patient Declined Utilities Interventions: Patient Declined   Readmission Risk Interventions     No data to display

## 2024-10-18 NOTE — Plan of Care (Signed)
  Problem: Education: Goal: Knowledge of General Education information will improve Description: Including pain rating scale, medication(s)/side effects and non-pharmacologic comfort measures Outcome: Progressing   Problem: Pain Managment: Goal: General experience of comfort will improve and/or be controlled Outcome: Progressing   Problem: Safety: Goal: Ability to remain free from injury will improve Outcome: Progressing   Problem: Skin Integrity: Goal: Risk for impaired skin integrity will decrease Outcome: Progressing   Problem: Skin Integrity: Goal: Skin integrity will improve Outcome: Progressing

## 2024-10-18 NOTE — Progress Notes (Signed)
 Patient transferred. AxOx4. VSS. All patient belongings kept at bedside transferred with patient to new room.

## 2024-10-18 NOTE — Plan of Care (Signed)
  Problem: Education: Goal: Knowledge of General Education information will improve Description: Including pain rating scale, medication(s)/side effects and non-pharmacologic comfort measures Outcome: Progressing   Problem: Clinical Measurements: Goal: Respiratory complications will improve Outcome: Progressing   Problem: Activity: Goal: Risk for activity intolerance will decrease Outcome: Progressing   Problem: Pain Managment: Goal: General experience of comfort will improve and/or be controlled Outcome: Progressing   Problem: Safety: Goal: Ability to remain free from injury will improve Outcome: Progressing

## 2024-10-18 NOTE — Discharge Instructions (Signed)
 Some PCP options in [place] area- not a comprehensive list  [facility]- [phone] [facility]- [phone] [facility]- [phone] [facility]- [phone] [facility]- [phone] Nichole Molly- [phone]  or [facility] Physician Referral Line [phone]

## 2024-10-18 NOTE — Progress Notes (Signed)
   10/18/24 1414  Vitals  BP 115/80  MAP (mmHg) 90  BP Location Right Arm  BP Method Automatic  Patient Position (if appropriate) Sitting  Pulse Rate 64  Pulse Rate Source Monitor  Resp 16  MEWS COLOR  MEWS Score Color Green  Oxygen Therapy  SpO2 98 %  O2 Device Room Air  MEWS Score  MEWS Temp 0  MEWS Systolic 0  MEWS Pulse 0  MEWS RR 0  MEWS LOC 0  MEWS Score 0   Patient transferred from ICU alert, room air, with stable vitals.  Safety precautions in place as plan of care continues.

## 2024-10-18 NOTE — Plan of Care (Signed)
  Problem: Education: Goal: Knowledge of General Education information will improve Description: Including pain rating scale, medication(s)/side effects and non-pharmacologic comfort measures Outcome: Progressing   Problem: Health Behavior/Discharge Planning: Goal: Ability to manage health-related needs will improve Outcome: Progressing   Problem: Clinical Measurements: Goal: Ability to maintain clinical measurements within normal limits will improve Outcome: Progressing Goal: Respiratory complications will improve Outcome: Progressing   Problem: Coping: Goal: Level of anxiety will decrease Outcome: Progressing   Problem: Clinical Measurements: Goal: Will remain free from infection Outcome: Not Progressing

## 2024-10-19 DIAGNOSIS — K122 Cellulitis and abscess of mouth: Secondary | ICD-10-CM | POA: Diagnosis not present

## 2024-10-19 LAB — BASIC METABOLIC PANEL WITH GFR
Anion gap: 8 (ref 5–15)
BUN: 26 mg/dL — ABNORMAL HIGH (ref 6–20)
CO2: 23 mmol/L (ref 22–32)
Calcium: 8.5 mg/dL — ABNORMAL LOW (ref 8.9–10.3)
Chloride: 102 mmol/L (ref 98–111)
Creatinine, Ser: 0.83 mg/dL (ref 0.61–1.24)
GFR, Estimated: >60 mL/min (ref >60)
Glucose, Bld: 142 mg/dL — ABNORMAL HIGH (ref 70–99)
Potassium: 4.2 mmol/L (ref 3.5–5.1)
Sodium: 133 mmol/L — ABNORMAL LOW (ref 135–145)

## 2024-10-19 LAB — CBC WITH DIFFERENTIAL/PLATELET
Abs Immature Granulocytes: 0.11 K/uL — ABNORMAL HIGH (ref 0.00–0.07)
Basophils Absolute: 0 K/uL (ref 0.0–0.1)
Basophils Relative: 0 %
Eosinophils Absolute: 0 K/uL (ref 0.0–0.5)
Eosinophils Relative: 0 %
HCT: 40.1 % (ref 39.0–52.0)
Hemoglobin: 14.1 g/dL (ref 13.0–17.0)
Immature Granulocytes: 1 %
Lymphocytes Relative: 6 %
Lymphs Abs: 0.8 K/uL (ref 0.7–4.0)
MCH: 32.8 pg (ref 26.0–34.0)
MCHC: 35.2 g/dL (ref 30.0–36.0)
MCV: 93.3 fL (ref 80.0–100.0)
Monocytes Absolute: 0.9 K/uL (ref 0.1–1.0)
Monocytes Relative: 6 %
Neutro Abs: 11.6 K/uL — ABNORMAL HIGH (ref 1.7–7.7)
Neutrophils Relative %: 87 %
Platelets: 276 K/uL (ref 150–400)
RBC: 4.3 MIL/uL (ref 4.22–5.81)
RDW: 11.9 % (ref 11.5–15.5)
WBC: 13.4 K/uL — ABNORMAL HIGH (ref 4.0–10.5)
nRBC: 0 % (ref 0.0–0.2)

## 2024-10-19 MED ORDER — AMOXICILLIN-POT CLAVULANATE 875-125 MG PO TABS: 1.0000 | ORAL_TABLET | Freq: Two times a day (BID) | ORAL | 0 refills | Status: AC

## 2024-10-19 MED ORDER — ACETAMINOPHEN 325 MG PO TABS: 650.0000 mg | ORAL_TABLET | Freq: Four times a day (QID) | ORAL | 0 refills | Status: AC | PRN

## 2024-10-19 MED ORDER — SENNOSIDES-DOCUSATE SODIUM 8.6-50 MG PO TABS: 1.0000 | ORAL_TABLET | Freq: Every evening | ORAL | 0 refills | Status: AC | PRN

## 2024-10-19 NOTE — Progress Notes (Signed)
 Discharge criteria met

## 2024-10-19 NOTE — Discharge Summary (Signed)
 Physician Discharge Summary   Patient: Mario Mcgee MRN: [id number] DOB: Nov 04, 1971  Admit date:     10/17/2024  Discharge date: 10/19/24  Discharge Physician: Drue ONEIDA Potter   PCP: Patient, No Pcp Per   Recommendations at discharge:  Follow-up with your dentist  Discharge Diagnoses: Principal Problem:   Ludwig angina  Resolved Problems:   * No resolved hospital problems. *  Hospital Course: Mario Mcgee is a 53 y.o. year old male without significant medical history presenting to the ED with worsening dental pain of 3 days duration and trismus.     Pt states he developed a toothache about 5 to 6 days before presentation that has been progressively getting worse.  Patient was seen by ENT on admission with improvement in his symptoms and has now being switched to oral antibiotic therapy and will follow-up with his dentist for teeth extraction.    Consultants: ENT Procedures performed: None Disposition: Home Diet recommendation:  Cardiac diet DISCHARGE MEDICATION: Allergies as of 10/19/2024   No Known Allergies      Medication List     STOP taking these medications    HYDROcodone -acetaminophen  5-325 MG tablet Commonly known as: NORCO/VICODIN   ketorolac  10 MG tablet Commonly known as: TORADOL        TAKE these medications    acetaminophen  325 MG tablet Commonly known as: TYLENOL  Take 2 tablets (650 mg total) by mouth every 6 (six) hours as needed for mild pain (pain score 1-3) or fever (or Fever >/= 101).   amoxicillin-clavulanate 875-125 MG tablet Commonly known as: AUGMENTIN Take 1 tablet by mouth 2 (two) times daily for 7 days.   amphetamine-dextroamphetamine 10 MG tablet Commonly known as: ADDERALL Take 10 mg by mouth every morning. What changed: Another medication with the same name was removed. Continue taking this medication, and follow the directions you see here.   senna-docusate 8.6-50 MG tablet Commonly known as: Senokot-S Take 1 tablet by mouth at  bedtime as needed for mild constipation.        Discharge Exam: Filed Weights   10/17/24 1642 10/18/24 0500 10/19/24 0239  Weight: 101.1 kg 102.6 kg 104.5 kg   HENT: Left submandibular swelling much improved CV: Regular rate and rhythm, good pulses Resp: CTAB, no stridor, protecting airway Abd: No TTP, no bowel sounds MSK: No symmetry, good bulk and tone Skin: No lesions noted on skin Neuro: Alert and oriented x 4 Psych: Pleasant mood  Condition at discharge: good  The results of significant diagnostics from this hospitalization (including imaging, microbiology, ancillary and laboratory) are listed below for reference.   Imaging Studies: CT Soft Tissue Neck W Contrast Result Date: 10/17/2024 EXAM: CT NECK WITH CONTRAST 10/17/2024 01:03:16 PM TECHNIQUE: CT of the neck was performed with the administration of 75 mL of iohexol (OMNIPAQUE) 300 MG/ML solution. Multiplanar reformatted images are provided for review. Automated exposure control, iterative reconstruction, and/or weight based adjustment of the mA/kV was utilized to reduce the radiation dose to as low as reasonably achievable. COMPARISON: None available. CLINICAL HISTORY: 53 year old male. Facial and left submandibular swelling, eval abscess. FINDINGS: AERODIGESTIVE TRACT: No discrete mass. There is mild asymmetric edema along the left lateral wall of the pharynx. The retropharyngeal and right parapharyngeal spaces appear more normal. Nasopharynx is negative. Negative visible subglottic airway. SALIVARY GLANDS: The left submandibular gland is asymmetrically enlarged and indistinct. There is no left submandibular duct dilatation identified. The left sublingual space is abnormal. The contralateral right sublingual and submandibular spaces are negative. The parotid  glands are unremarkable. THYROID: Unremarkable. LYMPH NODES: Small but reactive appearing left level 1 and level 2 lymph nodes, individually up to 8 mm short axis. SOFT  TISSUES: There is confluent inflammatory stranding in the left parapharyngeal space. There is an abnormal rim enhancing low density area tracking in the left mylohyoid muscle, best seen on coronal image 53 and also series 2 images 62 through 67. This lesion measures about 5 x 9 x 16 mm (AP x transverse x CC). The estimated volume of an ellipsoid from these measurements is 0 mL. There is asymmetric subperiosteal hypodensity along the posterior body of the left mandible, and this corresponds to the level of an impacted left mandible wisdom tooth. No mandible cortical dehiscence is identified. However, the hypodensity appears to track from that area in a relatively linear fashion into the abnormal left mylohyoid muscle. The central sublingual space remains normal. The anterior left sublingual space is inflamed. There is mild inflammation in the left lower masticator space. The upper left masticator space, left parotid space, and right masticator space are negative. Carious contralateral right mandible anterior molar. Major vascular structures in the bilateral neck and skull base are enhancing and patent. BRAIN, ORBITS, SINUSES AND MASTOIDS: No acute abnormality. Visible paranasal sinuses, middle ears and mastoids are well aerated. LUNGS AND MEDIASTINUM: No acute abnormality. Negative visible upper lungs and superior mediastinum. BONES: Ordinary cervical spine degeneration. IMPRESSION: 1. Multispatial inflammation centered at the left submandibular and sublingual spaces; constellation strongly suggests this is complication from a posterior mandible dental infection - with subperiosteal edema/phlegmon along the posterior body of the left mandible, and a small or developing intramuscular abscess (estimated 1 mL) tracking through the left mylohyoid muscle. But no dental periapical dehiscence is associated. 2. Secondary inflammation of the left submandibular gland, also in the left lower masticator and the left  parapharyngeal spaces. Small reactive left level 1 and level 2 lymph nodes. Electronically signed by: Helayne Hurst MD 10/17/2024 01:52 PM EST RP Workstation: [id number]    Microbiology: Results for orders placed or performed during the hospital encounter of 10/17/24  MRSA Next Gen by PCR, Nasal     Status: None   Collection Time: 10/17/24  4:48 PM   Specimen: Nasal Mucosa; Nasal Swab  Result Value Ref Range Status   MRSA by PCR Next Gen NOT DETECTED NOT DETECTED Final    Comment: (NOTE) The GeneXpert MRSA Assay (FDA approved for NASAL specimens only), is one component of a comprehensive MRSA colonization surveillance program. It is not intended to diagnose MRSA infection nor to guide or monitor treatment for MRSA infections. Test performance is not FDA approved in patients less than 46 years old. Performed at [facility], [address] [place], [place] [address]   Culture, blood (Routine X 2) w Reflex to ID Panel     Status: None (Preliminary result)   Collection Time: 10/17/24  5:21 PM   Specimen: BLOOD  Result Value Ref Range Status   Specimen Description BLOOD BLOOD RIGHT HAND  Final   Special Requests   Final    BOTTLES DRAWN AEROBIC AND ANAEROBIC Blood Culture adequate volume   Culture   Final    NO GROWTH 2 DAYS Performed at [facility], [address] [place], [place] [address]    Report Status PENDING  Incomplete  Culture, blood (Routine X 2) w Reflex to ID Panel     Status: None (Preliminary result)   Collection Time: 10/17/24  5:22 PM  Specimen: BLOOD  Result Value Ref Range Status   Specimen Description BLOOD BLOOD LEFT ARM  Final   Special Requests   Final    BOTTLES DRAWN AEROBIC AND ANAEROBIC Blood Culture adequate volume   Culture   Final    NO GROWTH 2 DAYS Performed at [facility], [address] [place], [place] [address]    Report Status PENDING  Incomplete    Labs: CBC: Recent Labs  Lab 10/17/24 1129  10/18/24 0540 10/19/24 0520  WBC 9.5 10.3 13.4*  NEUTROABS 7.5  --  11.6*  HGB 15.5 15.6 14.1  HCT 45.1 47.7 40.1  MCV 95.1 99.4 93.3  PLT 220 242 276   Basic Metabolic Panel: Recent Labs  Lab 10/17/24 1129 10/18/24 0540 10/19/24 0520  NA 137 134* 133*  K 3.9 4.7 4.2  CL 99 101 102  CO2 25 20* 23  GLUCOSE 120* 147* 142*  BUN 9 16 26*  CREATININE 0.83 0.86 0.83  CALCIUM 9.0 8.8* 8.5*  MG 2.3  --   --    Liver Function Tests: Recent Labs  Lab 10/17/24 1129  AST 32  ALT 37  ALKPHOS 57  BILITOT 2.2*  PROT 8.0  ALBUMIN 3.8   CBG: Recent Labs  Lab 10/17/24 1644 10/18/24 0753  GLUCAP 132* 135*    Discharge time spent:  35 minutes.  Signed: Drue ONEIDA Potter, MD [facility] 10/19/2024

## 2024-10-19 NOTE — Plan of Care (Signed)
  Problem: Education: Goal: Knowledge of General Education information will improve Description: Including pain rating scale, medication(s)/side effects and non-pharmacologic comfort measures Outcome: Progressing   Problem: Clinical Measurements: Goal: Ability to maintain clinical measurements within normal limits will improve Outcome: Progressing   Problem: Activity: Goal: Risk for activity intolerance will decrease Outcome: Progressing   Problem: Safety: Goal: Ability to remain free from injury will improve Outcome: Progressing   Problem: Skin Integrity: Goal: Risk for impaired skin integrity will decrease Outcome: Progressing   

## 2024-10-22 LAB — CULTURE, BLOOD (ROUTINE X 2)
Culture: NO GROWTH
Culture: NO GROWTH
Special Requests: ADEQUATE
Special Requests: ADEQUATE
